# Patient Record
Sex: Female | Born: 1975 | Race: Black or African American | Hispanic: No | Marital: Married | Smoking: Never smoker
Health system: Southern US, Community
[De-identification: ages and names within clinical notes are randomized; demographics above are authoritative.]

## PROBLEM LIST (undated history)

## (undated) DIAGNOSIS — N289 Disorder of kidney and ureter, unspecified: Secondary | ICD-10-CM

## (undated) DIAGNOSIS — R809 Proteinuria, unspecified: Secondary | ICD-10-CM

---

## 1998-08-20 ENCOUNTER — Emergency Department (HOSPITAL_COMMUNITY): Admission: EM | Admit: 1998-08-20 | Discharge: 1998-08-21 | Payer: Self-pay | Admitting: Emergency Medicine

## 1998-11-13 ENCOUNTER — Ambulatory Visit (HOSPITAL_COMMUNITY): Admission: RE | Admit: 1998-11-13 | Discharge: 1998-11-13 | Payer: Self-pay | Admitting: *Deleted

## 1999-01-30 ENCOUNTER — Ambulatory Visit (HOSPITAL_COMMUNITY): Admission: RE | Admit: 1999-01-30 | Discharge: 1999-01-30 | Payer: Self-pay | Admitting: *Deleted

## 1999-03-03 ENCOUNTER — Ambulatory Visit (HOSPITAL_COMMUNITY): Admission: RE | Admit: 1999-03-03 | Discharge: 1999-03-03 | Payer: Self-pay | Admitting: Obstetrics & Gynecology

## 1999-04-10 ENCOUNTER — Inpatient Hospital Stay (HOSPITAL_COMMUNITY): Admission: AD | Admit: 1999-04-10 | Discharge: 1999-04-13 | Payer: Self-pay | Admitting: *Deleted

## 1999-10-20 ENCOUNTER — Emergency Department (HOSPITAL_COMMUNITY): Admission: EM | Admit: 1999-10-20 | Discharge: 1999-10-20 | Payer: Self-pay | Admitting: Emergency Medicine

## 2001-05-12 ENCOUNTER — Other Ambulatory Visit: Admission: RE | Admit: 2001-05-12 | Discharge: 2001-05-12 | Payer: Self-pay | Admitting: Gynecology

## 2002-05-29 ENCOUNTER — Other Ambulatory Visit: Admission: RE | Admit: 2002-05-29 | Discharge: 2002-05-29 | Payer: Self-pay | Admitting: Gynecology

## 2003-06-11 ENCOUNTER — Other Ambulatory Visit: Admission: RE | Admit: 2003-06-11 | Discharge: 2003-06-11 | Payer: Self-pay | Admitting: Gynecology

## 2004-08-18 ENCOUNTER — Other Ambulatory Visit: Admission: RE | Admit: 2004-08-18 | Discharge: 2004-08-18 | Payer: Self-pay | Admitting: Gynecology

## 2004-08-18 ENCOUNTER — Ambulatory Visit: Payer: Self-pay | Admitting: Internal Medicine

## 2005-04-21 ENCOUNTER — Inpatient Hospital Stay (HOSPITAL_COMMUNITY): Admission: AD | Admit: 2005-04-21 | Discharge: 2005-04-23 | Payer: Self-pay | Admitting: Obstetrics & Gynecology

## 2005-12-08 ENCOUNTER — Ambulatory Visit: Payer: Self-pay | Admitting: Internal Medicine

## 2006-07-16 ENCOUNTER — Encounter: Admission: RE | Admit: 2006-07-16 | Discharge: 2006-07-16 | Payer: Self-pay | Admitting: Nephrology

## 2007-08-09 ENCOUNTER — Ambulatory Visit: Payer: Self-pay | Admitting: Internal Medicine

## 2007-08-09 DIAGNOSIS — J019 Acute sinusitis, unspecified: Secondary | ICD-10-CM

## 2007-09-13 ENCOUNTER — Ambulatory Visit: Payer: Self-pay | Admitting: Internal Medicine

## 2007-09-13 DIAGNOSIS — J309 Allergic rhinitis, unspecified: Secondary | ICD-10-CM | POA: Insufficient documentation

## 2008-04-03 IMAGING — US US RENAL
1 series · 14 of 25 positions shown · non-contrast
Comparison: None.

CLINICAL DATA: Hematuria and proteinuria.  
 RENAL/URINARY TRACT ULTRASOUND:
TECHNIQUE: Complete ultrasound examination of the urinary tract was performed including evaluation of the kidneys, renal collecting systems, and urinary bladder.

[Series 1: unknown · 0.25mm/px · 14 of 27 slices shown]
[im 1/27]
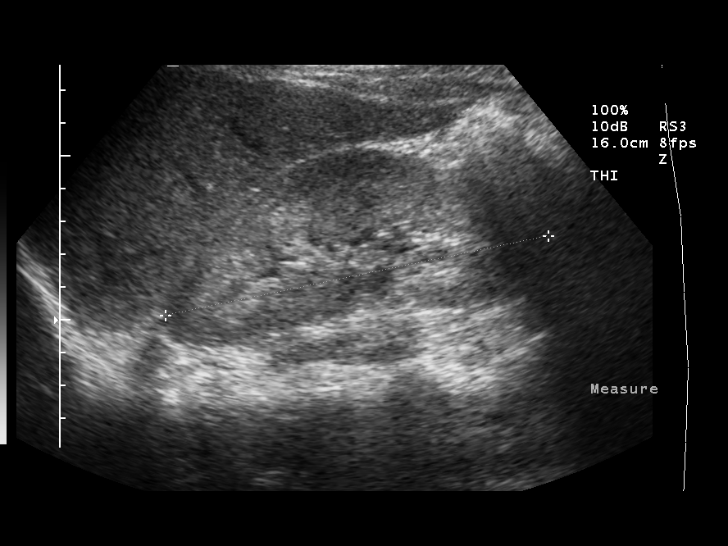
[im 3/27]
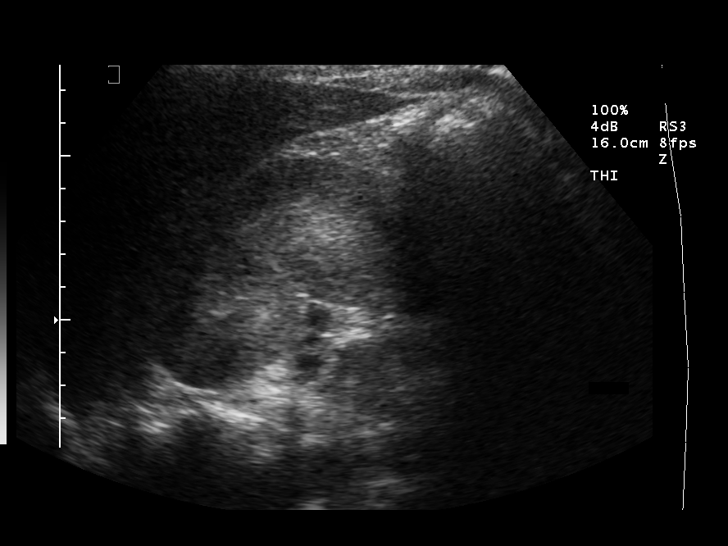
[im 5/27]
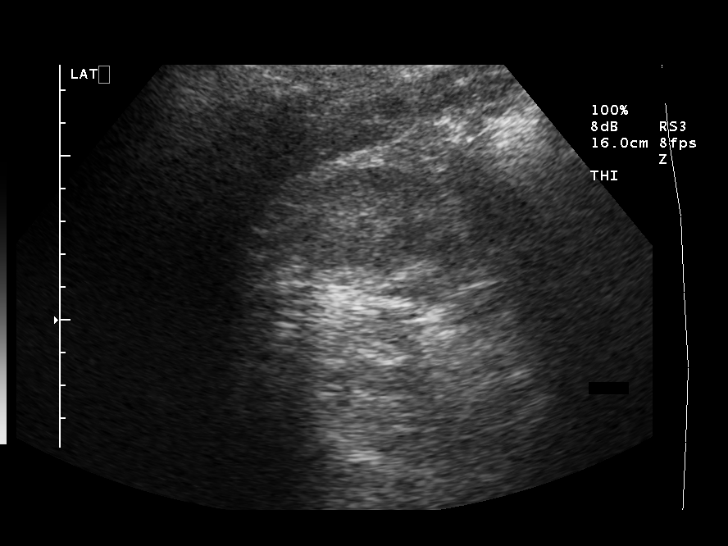
[im 7/27]
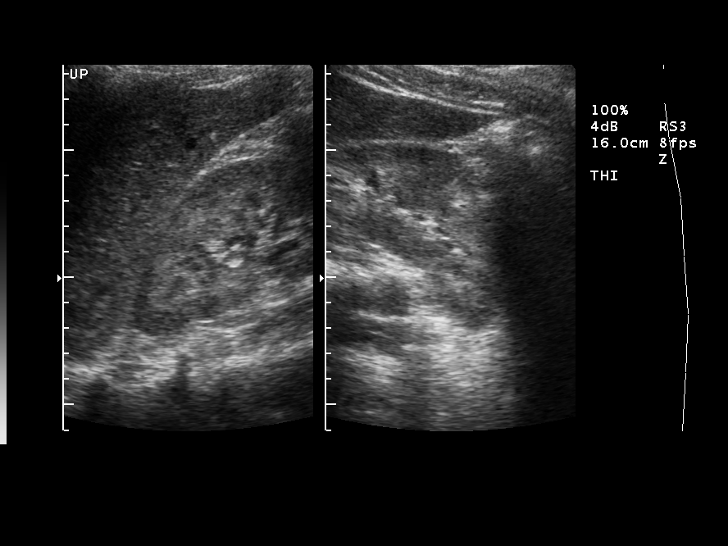
[im 9/27]
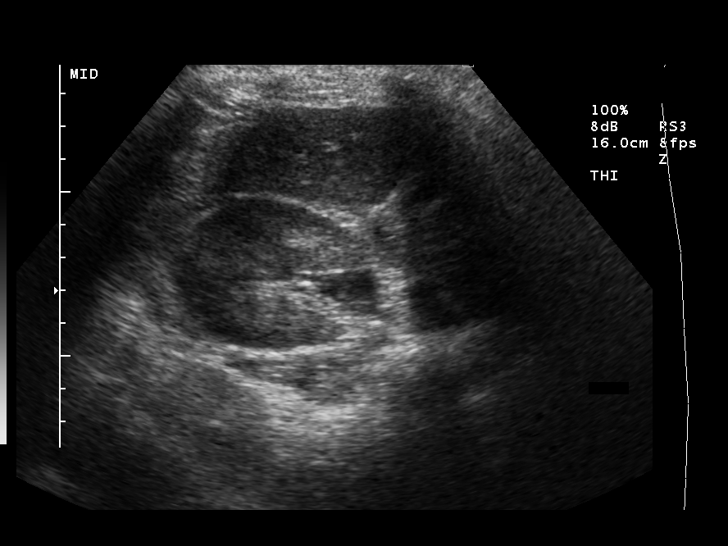
[im 10/27]
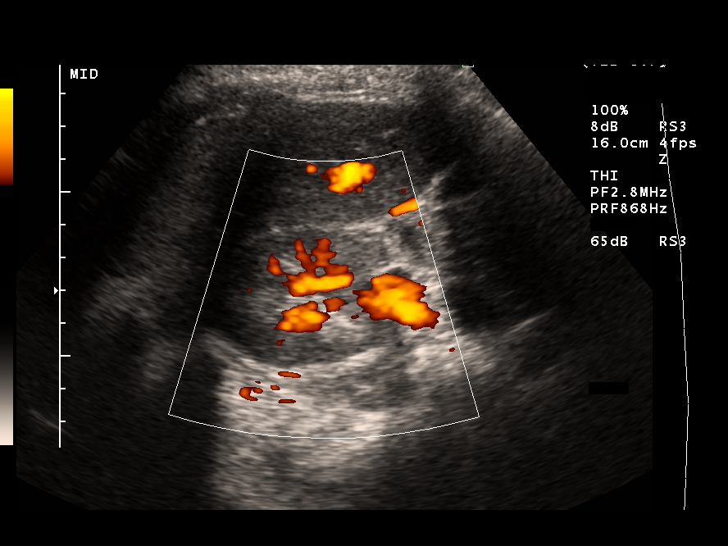
[im 12/27]
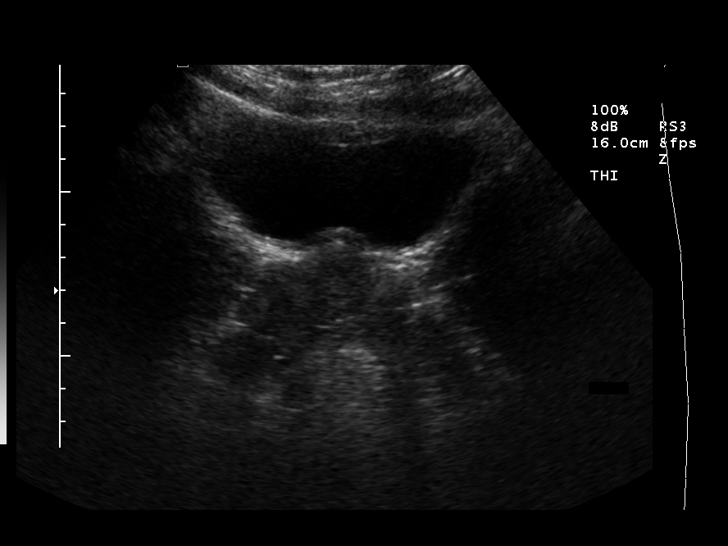
[im 15/27]
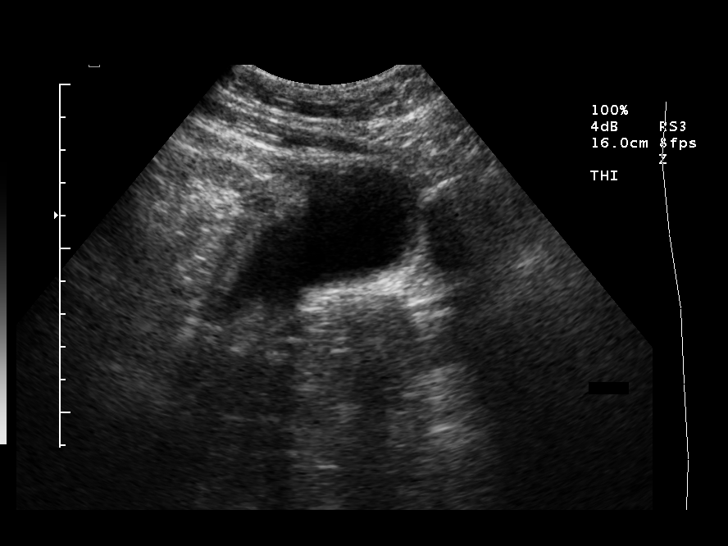
[im 17/27]
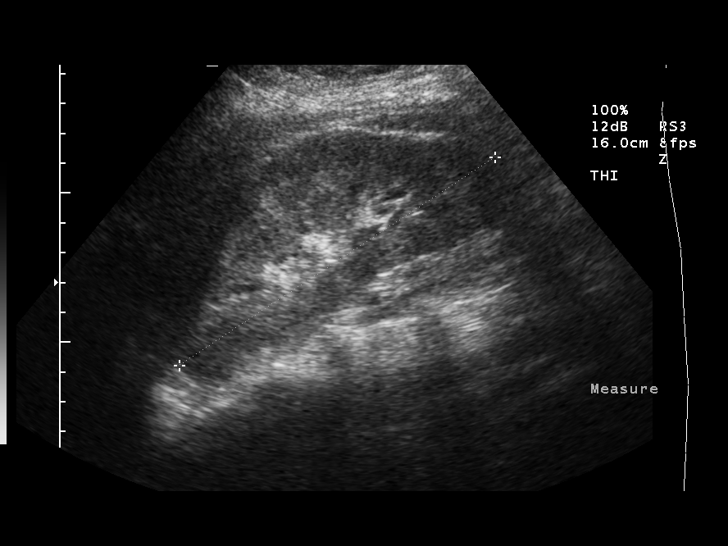
[im 18/27]
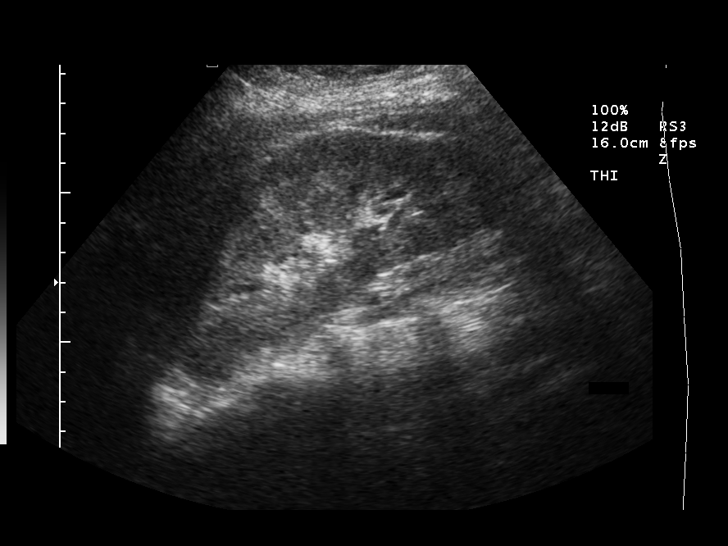
[im 20/27]
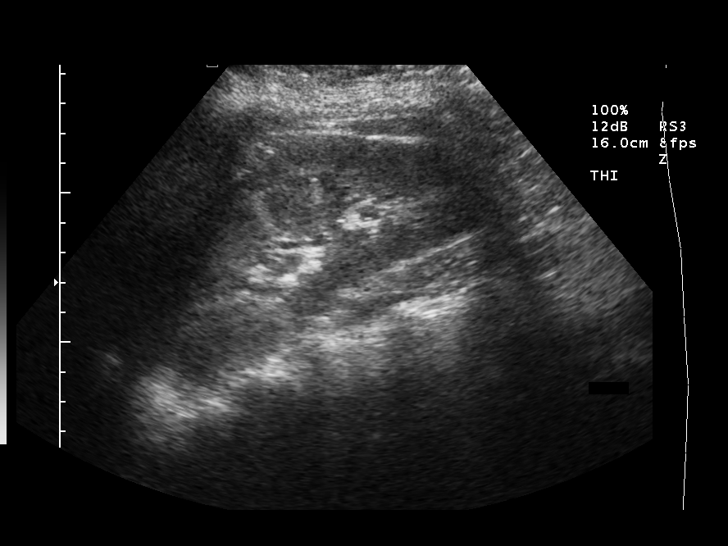
[im 22/27]
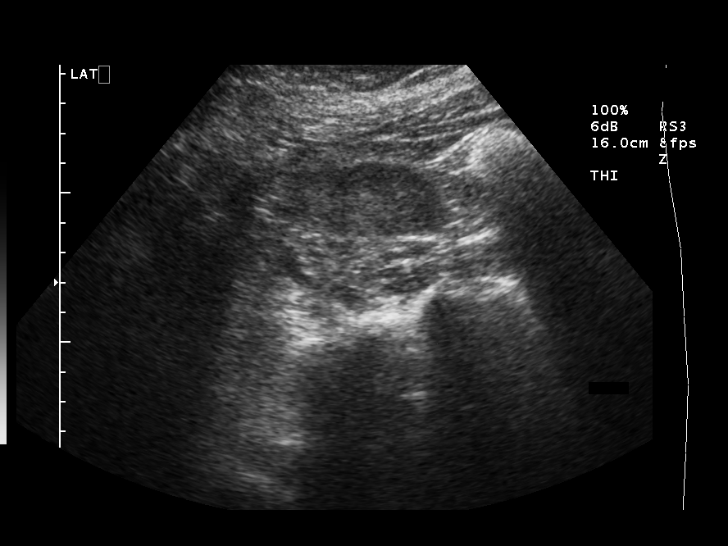
[im 24/27]
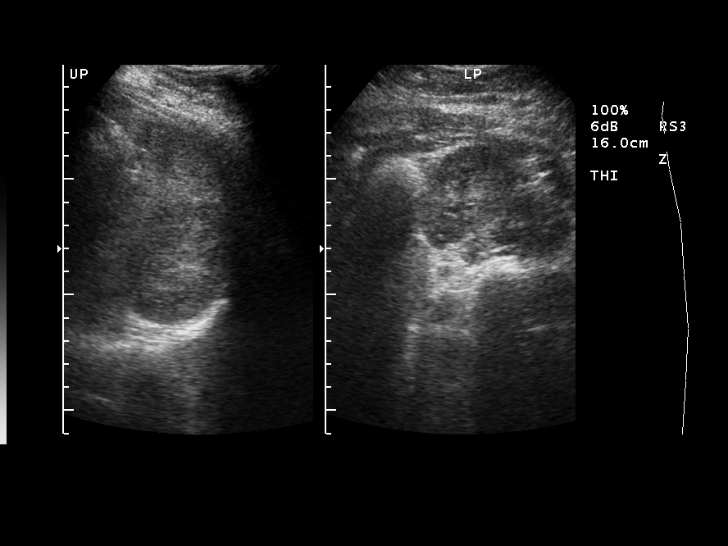
[im 27/27]
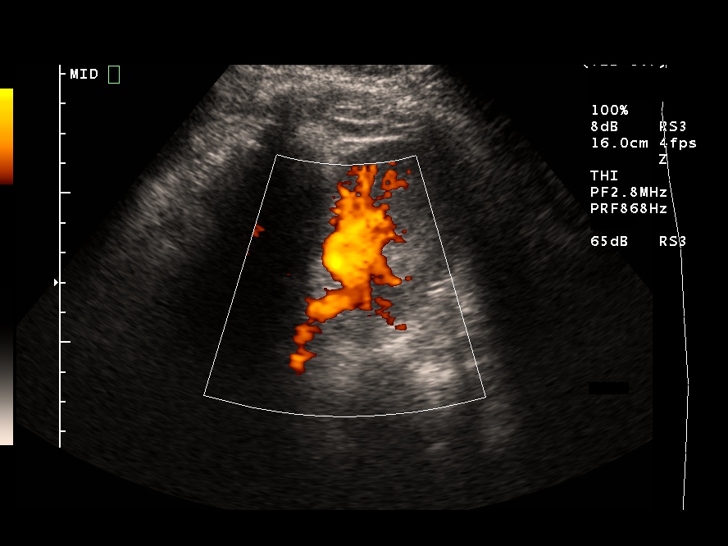

[14 of 25 positions shown; findings below may reference images not displayed]

FINDINGS: The right kidney measures 11.9 cm.  There is no obstruction or mass.   There is increased echogenicity in the cortex.  No calculi are identified. 
 The left kidney measures 12.7 cm in length.  The cortex is normal in thickness but is echogenic.  No mass or obstruction is identified.  The bladder is normal.
IMPRESSION: Normal sized kidneys without obstruction.  Increased cortical echogenicity compatible with medical renal disease.

## 2008-08-21 ENCOUNTER — Ambulatory Visit: Payer: Self-pay | Admitting: Internal Medicine

## 2009-09-20 ENCOUNTER — Ambulatory Visit: Payer: Self-pay | Admitting: Internal Medicine

## 2009-09-20 DIAGNOSIS — M7989 Other specified soft tissue disorders: Secondary | ICD-10-CM

## 2009-09-20 DIAGNOSIS — R799 Abnormal finding of blood chemistry, unspecified: Secondary | ICD-10-CM

## 2009-09-20 DIAGNOSIS — M79609 Pain in unspecified limb: Secondary | ICD-10-CM

## 2009-09-20 DIAGNOSIS — R5383 Other fatigue: Secondary | ICD-10-CM

## 2009-09-20 DIAGNOSIS — R5381 Other malaise: Secondary | ICD-10-CM

## 2009-09-23 LAB — CONVERTED CEMR LAB: Anti Nuclear Antibody(ANA): NEGATIVE

## 2009-09-24 LAB — CONVERTED CEMR LAB
ALT: 12 units/L (ref 0–35)
AST: 18 units/L (ref 0–37)
Albumin: 3.1 g/dL — ABNORMAL LOW (ref 3.5–5.2)
Alkaline Phosphatase: 63 units/L (ref 39–117)
BUN: 12 mg/dL (ref 6–23)
Basophils Absolute: 0 10*3/uL (ref 0.0–0.1)
Basophils Relative: 0.5 % (ref 0.0–3.0)
Bilirubin, Direct: 0.1 mg/dL (ref 0.0–0.3)
CO2: 28 meq/L (ref 19–32)
Calcium: 8.4 mg/dL (ref 8.4–10.5)
Chloride: 109 meq/L (ref 96–112)
Creatinine, Ser: 0.7 mg/dL (ref 0.4–1.2)
Eosinophils Absolute: 0.3 10*3/uL (ref 0.0–0.7)
Eosinophils Relative: 3.7 % (ref 0.0–5.0)
GFR calc non Af Amer: 125.82 mL/min (ref >60)
Glucose, Bld: 86 mg/dL (ref 70–99)
HCT: 38 % (ref 36.0–46.0)
Hemoglobin: 13.1 g/dL (ref 12.0–15.0)
Lymphocytes Relative: 21.2 % (ref 12.0–46.0)
Lymphs Abs: 1.7 10*3/uL (ref 0.7–4.0)
MCHC: 34.5 g/dL (ref 30.0–36.0)
MCV: 88.2 fL (ref 78.0–100.0)
Monocytes Absolute: 0.4 10*3/uL (ref 0.1–1.0)
Monocytes Relative: 5.2 % (ref 3.0–12.0)
Neutro Abs: 5.5 10*3/uL (ref 1.4–7.7)
Neutrophils Relative %: 69.4 % (ref 43.0–77.0)
Platelets: 252 10*3/uL (ref 150.0–400.0)
Potassium: 4.2 meq/L (ref 3.5–5.1)
RBC: 4.32 M/uL (ref 3.87–5.11)
RDW: 13 % (ref 11.5–14.6)
Rheumatoid fact SerPl-aCnc: 28.1 intl units/mL — ABNORMAL HIGH (ref 0.0–20.0)
Sed Rate: 31 mm/hr — ABNORMAL HIGH (ref 0–22)
Sodium: 142 meq/L (ref 135–145)
TSH: 1.58 microintl units/mL (ref 0.35–5.50)
Total Bilirubin: 0.5 mg/dL (ref 0.3–1.2)
Total Protein: 5.5 g/dL — ABNORMAL LOW (ref 6.0–8.3)
WBC: 7.9 10*3/uL (ref 4.5–10.5)

## 2010-06-10 NOTE — Miscellaneous (Signed)
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

## 2010-06-10 NOTE — Assessment & Plan Note (Signed)
Summary: SORE THROAT-CONGESTION-HANDS/LOWER LEGS PAIN/FEEL SWOLLEN DON...   Vital Signs:  Patient profile:   35 year old female Height:      63 inches Weight:      176.50 pounds BMI:     31.38 O2 Sat:      99 % on Room air Temp:     98.7 degrees F oral Pulse rate:   68 / minute Pulse rhythm:   regular BP sitting:   120 / 88  (left arm) Cuff size:   regular  Vitals Entered By: Rock Nephew CMA (Sep 20, 2009 3:33 PM)  O2 Flow:  Room air CC: congestion, sore throat w/ green mucus. Bilateral hand/foot swelling w tingling and pain Is Patient Diabetic? No   CC:  congestion and sore throat w/ green mucus. Bilateral hand/foot swelling w tingling and pain.  History of Present Illness: here withc acute onset facail pain, pressure, fever, adn greenish d/c on top of more chronic several months sinus congestion with itch, sneeze and no color;  no ST, cough and Pt denies CP, sob, doe, wheezing, orthopnea, pnd, worsening LE edema, palps, dizziness or syncope  Has unsusual fatigue recently as well without OSA symtpoms, depressive symptoms, wt loss or other constitutional symtpoms, but has had also several weeks diffuse swelling of the hand particularly the fingers and mcp with mild discomfort but not at all severe - just persistent and constant without overuse of the hands .  No trauma, injury of hx of same in the past.    Problems Prior to Update: 1)  Rheumatoid Factor, Positive  (ICD-790.99) 2)  Hand Pain, Bilateral  (ICD-729.5) 3)  Fatigue  (ICD-780.79) 4)  Edema, Hands  (ICD-729.81) 5)  Sinusitis- Acute-nos  (ICD-461.9) 6)  Sinusitis- Acute-nos  (ICD-461.9) 7)  Allergic Rhinitis  (ICD-477.9) 8)  Sinusitis- Acute-nos  (ICD-461.9) 9)  Sinusitis- Acute-nos  (ICD-461.9) 10)  Family History of Alcoholism/addiction  (ICD-V61.41)  Medications Prior to Update: 1)  Bcp .Marland Kitchen.. 1 By Mouth Qd 2)  Mult. Vit. .... 1 By Mouth Qd 3)  Cephalexin 500 Mg Caps (Cephalexin) .Marland Kitchen.. 1 By Mouth Three Times A  Day 4)  Cetirizine Hcl 10 Mg  Tabs (Cetirizine Hcl) .Marland Kitchen.. 1po Once Daily Prn  Current Medications (verified): 1)  Bcp .Marland Kitchen.. 1 By Mouth Qd 2)  Mult. Vit. .... 1 By Mouth Qd 3)  Doxycycline Hyclate 100 Mg Caps (Doxycycline Hyclate) .Marland Kitchen.. 1po Two Times A Day 4)  Hydrochlorothiazide 12.5 Mg Caps (Hydrochlorothiazide) .Marland Kitchen.. 1po Once Daily As Needed Swelling 5)  Tessalon Perles 100 Mg Caps (Benzonatate) .Marland Kitchen.. 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  , mild ill  Head:  normocephalic and atraumatic.   Eyes:  vision grossly intact, pupils equal, and pupils round.   Ears:  bilat tm's red, sinus tender bilat Nose:  nasal dischargemucosal pallor and mucosal edema.   Mouth:  pharyngeal erythema and  fair dentition.   Neck:  supple and cervical lymphadenopathy.   Lungs:  normal respiratory effort and normal breath sounds.   Heart:  normal rate and regular rhythm.   Msk:  diffuse swelling or the bilat hands and digits, mild with mild tender but no blearly worse at the MCPs to me Extremities:  no edema, no erythema  Neurologic:  strength normal in all extremities.     Impression & Recommendations:  Problem # 1:  SINUSITIS- ACUTE-NOS (ICD-461.9)  Her updated medication list for this problem includes:    Doxycycline Hyclate 100 Mg Caps (Doxycycline  hyclate) .Marland Kitchen... 1po two times a day    Tessalon Perles 100 Mg Caps (Benzonatate) .Marland Kitchen... 1 - 2 by mouth three times a day as needed for cough treat as above, f/u any worsening signs or symptoms   Problem # 2:  EDEMA, HANDS (ICD-729.81)  unusual with mild discomfort with mild diffuse soft tissue swelling, but not c/w overuse it seems - for hctz 12.5 as needed, check esr, rf, ana, and consider rheum consult  Orders: T-Antinuclear Antib (ANA) ([id number]) TLB-Sedimentation Rate (ESR) (85652-ESR) TLB-Rheumatoid Factor (RA) (09811-BJ)  Problem # 3:  ALLERGIC RHINITIS (ICD-477.9)  The following medications were removed from the medication list:    Cetirizine Hcl 10 Mg Tabs (Cetirizine hcl) .Marland Kitchen... 1po once daily prn for otc claritin as needed   Problem # 4:  FATIGUE (ICD-780.79) exam benign, to check labs below; follow with expectant management  Orders: TLB-BMP (Basic Metabolic Panel-BMET) (80048-METABOL) TLB-CBC Platelet - w/Differential (85025-CBCD) TLB-Hepatic/Liver Function Pnl (80076-HEPATIC) TLB-TSH (Thyroid Stimulating Hormone) (84443-TSH)  Complete Medication List: 1)  Bcp  .Marland Kitchen.. 1 by mouth qd 2)  Mult. Vit.  .... 1 by mouth qd 3)  Doxycycline Hyclate 100 Mg Caps (Doxycycline hyclate) .Marland Kitchen.. 1po two times a day 4)  Hydrochlorothiazide 12.5 Mg Caps (Hydrochlorothiazide) .Marland Kitchen.. 1po once daily as needed swelling 5)  Tessalon Perles 100 Mg Caps (Benzonatate) .Marland Kitchen.. 1 - 2 by mouth three times a day as needed for cough  Patient Instructions: 1)  Please take all new medications as prescribed 2)  Continue all previous medications as before this visit  3)  Please go to the Lab in the basement for your blood tests today  4)  Please schedule a follow-up appointment as needed. Prescriptions: TESSALON PERLES 100 MG CAPS (BENZONATATE) 1 - 2 by mouth three times a day as needed for cough  #60 x 1   Entered and Authorized by:   Corwin Levins MD   Signed by:   Corwin Levins MD on 09/20/2009   Method  used:   Print then Give to Patient   RxID:   [id number] HYDROCHLOROTHIAZIDE 12.5 MG CAPS (HYDROCHLOROTHIAZIDE) 1po once daily as needed swelling  #30 x 5   Entered and Authorized by:   Corwin Levins MD   Signed by:   Corwin Levins MD on 09/20/2009   Method used:   Print then Give to Patient   RxID:   [id number] DOXYCYCLINE HYCLATE 100 MG CAPS (DOXYCYCLINE HYCLATE) 1po two times a day  #20 x 0   Entered and Authorized by:   Corwin Levins MD   Signed by:   Corwin Levins MD on 09/20/2009   Method used:   Print then Give to Patient   RxID:   [id number]

## 2012-01-07 ENCOUNTER — Emergency Department (HOSPITAL_BASED_OUTPATIENT_CLINIC_OR_DEPARTMENT_OTHER)
Admission: EM | Admit: 2012-01-07 | Discharge: 2012-01-07 | Disposition: A | Discharge status: Home / Self Care | Payer: BC Managed Care – PPO | Attending: Emergency Medicine | Admitting: Emergency Medicine

## 2012-01-07 ENCOUNTER — Encounter (HOSPITAL_BASED_OUTPATIENT_CLINIC_OR_DEPARTMENT_OTHER): Payer: Self-pay | Admitting: *Deleted

## 2012-01-07 DIAGNOSIS — R51 Headache: Secondary | ICD-10-CM | POA: Insufficient documentation

## 2012-01-07 DIAGNOSIS — I10 Essential (primary) hypertension: Secondary | ICD-10-CM | POA: Insufficient documentation

## 2012-01-07 DIAGNOSIS — R944 Abnormal results of kidney function studies: Secondary | ICD-10-CM | POA: Insufficient documentation

## 2012-01-07 HISTORY — DX: Proteinuria, unspecified: R80.9

## 2012-01-07 LAB — BASIC METABOLIC PANEL
CO2: 26 mEq/L (ref 19–32)
Calcium: 9 mg/dL (ref 8.4–10.5)
Creatinine, Ser: 1.6 mg/dL — ABNORMAL HIGH (ref 0.50–1.10)
Glucose, Bld: 130 mg/dL — ABNORMAL HIGH (ref 70–99)

## 2012-01-07 LAB — CBC WITH DIFFERENTIAL/PLATELET
Basophils Absolute: 0 10*3/uL (ref 0.0–0.1)
Eosinophils Absolute: 0.2 10*3/uL (ref 0.0–0.7)
Eosinophils Relative: 2 % (ref 0–5)
MCH: 29.3 pg (ref 26.0–34.0)
MCV: 83 fL (ref 78.0–100.0)
Platelets: 234 10*3/uL (ref 150–400)
RDW: 12.6 % (ref 11.5–15.5)

## 2012-01-07 LAB — PREGNANCY, URINE: Preg Test, Ur: NEGATIVE

## 2012-01-07 MED ORDER — HYDROCHLOROTHIAZIDE 25 MG PO TABS
25.0000 mg | ORAL_TABLET | Freq: Every day | ORAL | Status: DC
  Administered 2012-01-07: 25 mg via ORAL
  Filled 2012-01-07: qty 1

## 2012-01-07 NOTE — ED Notes (Signed)
Patient states she has a 4-5 month history of headaches.  Started seeing her PCP for them in June.  Was referred to a Neurologist and was seen 2 days ago.  BP was found to be 190/130.  Was started on a pain med for her headaches and a blood pressure medication.  Was to follow up today with PCP and could not get an early appointment and was sent here for further evaluation.

## 2012-01-07 NOTE — ED Provider Notes (Signed)
History     CSN: [id number]  Arrival date & time 01/07/12  1210   First MD Initiated Contact with Patient 01/07/12 1259      Chief Complaint  Patient presents with  . Hypertension    (Consider location/radiation/quality/duration/timing/severity/associated sxs/prior treatment) Patient is a 36 y.o. female presenting with hypertension. The history is provided by the patient. No language interpreter was used.  Hypertension This is a new problem. Episode onset: 2 days ago. The problem occurs constantly. The problem has been gradually worsening. Associated symptoms include headaches. Pertinent negatives include no fever. Nothing aggravates the symptoms. She has tried nothing for the symptoms. The treatment provided no relief.  Pt saw her neurologist 2 days ago for headaches and she was diagnosed with htn.  Pt is taking metoprolol 25 mg every night.   Past Medical History  Diagnosis Date  . Protein in urine     History reviewed. No pertinent past surgical history.  No family history on file.  History  Substance Use Topics  . Smoking status: Never Smoker   . Smokeless tobacco: Not on file  . Alcohol Use: No    OB History    Grav Para Term Preterm Abortions TAB SAB Ect Mult Living                  Review of Systems  Constitutional: Negative for fever.  Neurological: Positive for headaches.  All other systems reviewed and are negative.    Allergies  Review of patient's allergies indicates no known allergies.  Home Medications   Current Outpatient Rx  Name Route Sig Dispense Refill  . METOPROLOL TARTRATE 25 MG PO TABS Oral Take 25 mg by mouth 2 (two) times daily.      BP 171/126  Pulse 90  Temp 97.9 F (36.6 C) (Axillary)  Resp 18  Ht 5\' 3"  (1.6 m)  Wt 170 lb (77.111 kg)  BMI 30.11 kg/m2  SpO2 100%  LMP 12/24/2011  Physical Exam  Nursing note and vitals reviewed. Constitutional: She is oriented to person, place, and time. She appears well-developed and  well-nourished.  HENT:  Head: Normocephalic and atraumatic.  Right Ear: External ear normal.  Left Ear: External ear normal.  Nose: Nose normal.  Mouth/Throat: Oropharynx is clear and moist.  Eyes: Conjunctivae and EOM are normal. Pupils are equal, round, and reactive to light.  Neck: Normal range of motion. Neck supple.  Cardiovascular: Normal rate.   Pulmonary/Chest: Effort normal and breath sounds normal.  Abdominal: Soft.  Musculoskeletal: Normal range of motion.  Neurological: She is alert and oriented to person, place, and time. She has normal reflexes.  Skin: Skin is warm.  Psychiatric: She has a normal mood and affect.    ED Course  Procedures (including critical care time)   Labs Reviewed  PREGNANCY, URINE  BASIC METABOLIC PANEL  CBC WITH DIFFERENTIAL   No results found.   No diagnosis found.   Date: 01/07/2012  Rate:75  Rhythm: normal sinus rhythm  QRS Axis: normal  Intervals: normal  ST/T Wave abnormalities: normal  Conduction Disutrbances:none  Narrative Interpretation:   Old EKG Reviewed: none available   MDM  Pt counseled on elevated creatine level.   I advised pt with the history of protein in her urine I think her MD should follow her creatine.  I advised nephrology consult.   Pt advised to increase metoprolol to twice a day        Lonia Skinner [place], [place] 01/07/12 1554

## 2012-01-07 NOTE — ED Notes (Signed)
Karen Sophia, PA-C at bedside. 

## 2012-01-08 NOTE — ED Provider Notes (Signed)
Medical screening examination/treatment/procedure(s) were performed by non-physician practitioner and as supervising physician I was immediately available for consultation/collaboration.  Geoffery Lyons, MD 01/08/12 [phone]

## 2014-08-25 ENCOUNTER — Emergency Department (HOSPITAL_BASED_OUTPATIENT_CLINIC_OR_DEPARTMENT_OTHER): Payer: BC Managed Care – PPO

## 2014-08-25 ENCOUNTER — Emergency Department (HOSPITAL_BASED_OUTPATIENT_CLINIC_OR_DEPARTMENT_OTHER)
Admission: EM | Admit: 2014-08-25 | Discharge: 2014-08-25 | Disposition: A | Discharge status: Home / Self Care | Payer: BC Managed Care – PPO | Attending: Emergency Medicine | Admitting: Emergency Medicine

## 2014-08-25 ENCOUNTER — Encounter (HOSPITAL_BASED_OUTPATIENT_CLINIC_OR_DEPARTMENT_OTHER): Payer: Self-pay | Admitting: *Deleted

## 2014-08-25 DIAGNOSIS — I82402 Acute embolism and thrombosis of unspecified deep veins of left lower extremity: Secondary | ICD-10-CM | POA: Insufficient documentation

## 2014-08-25 DIAGNOSIS — Z79899 Other long term (current) drug therapy: Secondary | ICD-10-CM | POA: Insufficient documentation

## 2014-08-25 DIAGNOSIS — Z87448 Personal history of other diseases of urinary system: Secondary | ICD-10-CM | POA: Diagnosis not present

## 2014-08-25 DIAGNOSIS — M79605 Pain in left leg: Secondary | ICD-10-CM | POA: Diagnosis present

## 2014-08-25 HISTORY — DX: Disorder of kidney and ureter, unspecified: N28.9

## 2014-08-25 LAB — BASIC METABOLIC PANEL
ANION GAP: 5 (ref 5–15)
BUN: 25 mg/dL — ABNORMAL HIGH (ref 6–23)
CALCIUM: 8.8 mg/dL (ref 8.4–10.5)
CHLORIDE: 109 mmol/L (ref 96–112)
CO2: 23 mmol/L (ref 19–32)
CREATININE: 1.64 mg/dL — AB (ref 0.50–1.10)
GFR, EST AFRICAN AMERICAN: 45 mL/min — AB (ref >90)
GFR, EST NON AFRICAN AMERICAN: 39 mL/min — AB (ref >90)
GLUCOSE: 99 mg/dL (ref 70–99)
POTASSIUM: 4.7 mmol/L (ref 3.5–5.1)
Sodium: 137 mmol/L (ref 135–145)

## 2014-08-25 LAB — CBC WITH DIFFERENTIAL/PLATELET
BASOS ABS: 0 10*3/uL (ref 0.0–0.1)
BASOS PCT: 0 % (ref 0–1)
EOS ABS: 0.6 10*3/uL (ref 0.0–0.7)
EOS PCT: 9 % — AB (ref 0–5)
HCT: 35.6 % — ABNORMAL LOW (ref 36.0–46.0)
Hemoglobin: 11.9 g/dL — ABNORMAL LOW (ref 12.0–15.0)
LYMPHS PCT: 19 % (ref 12–46)
Lymphs Abs: 1.4 10*3/uL (ref 0.7–4.0)
MCH: 28.9 pg (ref 26.0–34.0)
MCHC: 33.4 g/dL (ref 30.0–36.0)
MCV: 86.4 fL (ref 78.0–100.0)
MONO ABS: 0.6 10*3/uL (ref 0.1–1.0)
MONOS PCT: 8 % (ref 3–12)
Neutro Abs: 4.7 10*3/uL (ref 1.7–7.7)
Neutrophils Relative %: 64 % (ref 43–77)
Platelets: 184 10*3/uL (ref 150–400)
RBC: 4.12 MIL/uL (ref 3.87–5.11)
RDW: 12.9 % (ref 11.5–15.5)
WBC: 7.2 10*3/uL (ref 4.0–10.5)

## 2014-08-25 MED ORDER — XARELTO VTE STARTER PACK 15 & 20 MG PO TBPK: 15.0000 mg | ORAL_TABLET | ORAL | Status: DC

## 2014-08-25 NOTE — Discharge Instructions (Signed)
Deep Vein Thrombosis °A deep vein thrombosis (DVT) is a blood clot that develops in the deep, larger veins of the leg, arm, or pelvis. These are more dangerous than clots that might form in veins near the surface of the body. A DVT can lead to serious and even life-threatening complications if the clot breaks off and travels in the bloodstream to the lungs.  °A DVT can damage the valves in your leg veins so that instead of flowing upward, the blood pools in the lower leg. This is called post-thrombotic syndrome, and it can result in pain, swelling, discoloration, and sores on the leg. °CAUSES °Usually, several things contribute to the formation of blood clots. Contributing factors include: °· The flow of blood slows down. °· The inside of the vein is damaged in some way. °· You have a condition that makes blood clot more easily. °RISK FACTORS °Some people are more likely than others to develop blood clots. Risk factors include:  °· Smoking. °· Being overweight (obese). °· Sitting or lying still for a long time. This includes long-distance travel, paralysis, or recovery from an illness or surgery. °Other factors that increase risk are:  °· Older age, especially over 75 years of age. °· Having a family history of blood clots or if you have already had a blot clot. °· Having major or lengthy surgery. This is especially true for surgery on the hip, knee, or belly (abdomen). Hip surgery is particularly high risk. °· Having a long, thin tube (catheter) placed inside a vein during a medical procedure. °· Breaking a hip or leg. °· Having cancer or cancer treatment. °· Pregnancy and childbirth. °¨ Hormone changes make the blood clot more easily during pregnancy. °¨ The fetus puts pressure on the veins of the pelvis. °¨ There is a risk of injury to veins during delivery or a caesarean delivery. The risk is highest just after childbirth. °· Medicines containing the female hormone estrogen. This includes birth control pills and  hormone replacement therapy. °· Other circulation or heart problems. ° °SIGNS AND SYMPTOMS °When a clot forms, it can either partially or totally block the blood flow in that vein. Symptoms of a DVT can include: °· Swelling of the leg or arm, especially if one side is much worse. °· Warmth and redness of the leg or arm, especially if one side is much worse. °· Pain in an arm or leg. If the clot is in the leg, symptoms may be more noticeable or worse when standing or walking. °The symptoms of a DVT that has traveled to the lungs (pulmonary embolism, PE) usually start suddenly and include: °· Shortness of breath. °· Coughing. °· Coughing up blood or blood-tinged mucus. °· Chest pain. The chest pain is often worse with deep breaths. °· Rapid heartbeat. °Anyone with these symptoms should get emergency medical treatment right away. Do not wait to see if the symptoms will go away. Call your local emergency services (911 in the [place] if you have these symptoms. Do not drive yourself to the hospital. °DIAGNOSIS °If a DVT is suspected, your health care provider will take a full medical history and perform a physical exam. Tests that also may be required include: °· Blood tests, including studies of the clotting properties of the blood. °· Ultrasound to see if you have clots in your legs or lungs. °· X-rays to show the flow of blood when dye is injected into the veins (venogram). °· Studies of your lungs if you have any   chest symptoms. °PREVENTION °· Exercise the legs regularly. Take a brisk 30-minute walk every day. °· Maintain a weight that is appropriate for your height. °· Avoid sitting or lying in bed for long periods of time without moving your legs. °· Women, particularly those over the age of 35 years, should consider the risks and benefits of taking estrogen medicines, including birth control pills. °· Do not smoke, especially if you take estrogen medicines. °· Long-distance travel can increase your risk of DVT. You  should exercise your legs by walking or pumping the muscles every hour. °· Many of the risk factors above relate to situations that exist with hospitalization, either for illness, injury, or elective surgery. Prevention may include medical and nonmedical measures. °¨ Your health care provider will assess you for the need for venous thromboembolism prevention when you are admitted to the hospital. If you are having surgery, your surgeon will assess you the day of or day after surgery. °TREATMENT °Once identified, a DVT can be treated. It can also be prevented in some circumstances. Once you have had a DVT, you may be at increased risk for a DVT in the future. The most common treatment for DVT is blood-thinning (anticoagulant) medicine, which reduces the blood's tendency to clot. Anticoagulants can stop new blood clots from forming and stop old clots from growing. They cannot dissolve existing clots. Your body does this by itself over time. Anticoagulants can be given by mouth, through an IV tube, or by injection. Your health care provider will determine the best program for you. Other medicines or treatments that may be used are: °· Heparin or related medicines (low molecular weight heparin) are often the first treatment for a blood clot. They act quickly. However, they cannot be taken orally and must be given either in shot form or by IV tube. °¨ Heparin can cause a fall in a component of blood that stops bleeding and forms blood clots (platelets). You will be monitored with blood tests to be sure this does not occur. °· Warfarin is an anticoagulant that can be swallowed. It takes a few days to start working, so usually heparin or related medicines are used in combination. Once warfarin is working, heparin is usually stopped. °· Factor Xa inhibitor medicines, such as rivaroxaban and apixaban, also reduce blood clotting. These medicines are taken orally and can often be used without heparin or related  medicines. °· Less commonly, clot dissolving drugs (thrombolytics) are used to dissolve a DVT. They carry a high risk of bleeding, so they are used mainly in severe cases where your life or a part of your body is threatened. °· Very rarely, a blood clot in the leg needs to be removed surgically. °· If you are unable to take anticoagulants, your health care provider may arrange for you to have a filter placed in a main vein in your abdomen. This filter prevents clots from traveling to your lungs. °HOME CARE INSTRUCTIONS °· Take all medicines as directed by your health care provider. °· Learn as much as you can about DVT. °· Wear a medical alert bracelet or carry a medical alert card. °· Ask your health care provider how soon you can go back to normal activities. It is important to stay active to prevent blood clots. If you are on anticoagulant medicine, avoid contact sports. °· It is very important to exercise. This is especially important while traveling, sitting, or standing for long periods of time. Exercise your legs by walking or by   tightening and relaxing your leg muscles regularly. Take frequent walks. °· You may need to wear compression stockings. These are tight elastic stockings that apply pressure to the lower legs. This pressure can help keep the blood in the legs from clotting. °Taking Warfarin °Warfarin is a daily medicine that is taken by mouth. Your health care provider will advise you on the length of treatment (usually 3-6 months, sometimes lifelong). If you take warfarin: °· Understand how to take warfarin and foods that can affect how warfarin works in your body. °· Too much and too little warfarin are both dangerous. Too much warfarin increases the risk of bleeding. Too little warfarin continues to allow the risk for blood clots. °Warfarin and Regular Blood Testing °While taking warfarin, you will need to have regular blood tests to measure your blood clotting time. These blood tests usually  include both the prothrombin time (PT) and international normalized ratio (INR) tests. The PT and INR results allow your health care provider to adjust your dose of warfarin. It is very important that you have your PT and INR tested as often as directed by your health care provider.    °Warfarin and Your Diet °Avoid major changes in your diet, or notify your health care provider before changing your diet. Arrange a visit with a registered dietitian to answer your questions. Many foods, especially foods high in vitamin K, can interfere with warfarin and affect the PT and INR results. You should eat a consistent amount of foods high in vitamin K. Foods high in vitamin K include:  °· Spinach, kale, broccoli, cabbage, collard and turnip greens, Brussels sprouts, peas, cauliflower, seaweed, and parsley. °· Beef and pork liver. °· Green tea. °· Soybean oil. °Warfarin with Other Medicines °Many medicines can interfere with warfarin and affect the PT and INR results. You must: °· Tell your health care provider about any and all medicines, vitamins, and supplements you take, including aspirin and other over-the-counter anti-inflammatory medicines. Be especially cautious with aspirin and anti-inflammatory medicines. Ask your health care provider before taking these. °· Do not take or discontinue any prescribed or over-the-counter medicine except on the advice of your health care provider or pharmacist. °Warfarin Side Effects °Warfarin can have side effects, such as easy bruising and difficulty stopping bleeding. Ask your health care provider or pharmacist about other side effects of warfarin. You will need to: °· Hold pressure over cuts for longer than usual. °· Notify your dentist and other health care providers that you are taking warfarin before you undergo any procedures where bleeding may occur. °Warfarin with Alcohol and Tobacco  °· Drinking alcohol frequently can increase the effect of warfarin, leading to excess  bleeding. It is best to avoid alcoholic drinks or to consume only very small amounts while taking warfarin. Notify your health care provider if you change your alcohol intake.   °· Do not use any tobacco products including cigarettes, chewing tobacco, or electronic cigarettes. If you smoke, quit. Ask your health care provider for help with quitting smoking. °Alternative Medicines to Warfarin: Factor Xa Inhibitor Medicines °· These blood-thinning medicines are taken by mouth, usually for several weeks or longer. It is important to take the medicine every single day at the same time each day. °· There are no regular blood tests required when using these medicines. °· There are fewer food and drug interactions than with warfarin. °· The side effects of this class of medicine are similar to those of warfarin, including excessive bruising or bleeding. Ask your   health care provider or pharmacist about other potential side effects. °SEEK MEDICAL CARE IF: °· You notice a rapid heartbeat. °· You feel weaker or more tired than usual. °· You feel faint. °· You notice increased bruising. °· You feel your symptoms are not getting better in the time expected. °· You believe you are having side effects of medicine. °SEEK IMMEDIATE MEDICAL CARE IF: °· You have chest pain. °· You have trouble breathing. °· You have new or increased swelling or pain in one leg. °· You cough up blood. °· You notice blood in vomit, in a bowel movement, or in urine. °MAKE SURE YOU: °· Understand these instructions. °· Will watch your condition. °· Will get help right away if you are not doing well or get worse. °Document Released: 04/27/2005 Document Revised: 09/11/2013 Document Reviewed: 01/02/2013 °[employer]® Patient Information ©2015 [employer]. This information is not intended to replace advice given to you by your health care provider. Make sure you discuss any questions you have with your health care provider. ° °Rivaroxaban oral tablets °What  is this medicine? °RIVAROXABAN (ri va ROX a ban) is an anticoagulant (blood thinner). It is used to treat blood clots in the lungs or in the veins. It is also used after knee or hip surgeries to prevent blood clots. It is also used to lower the chance of stroke in people with a medical condition called atrial fibrillation. °This medicine may be used for other purposes; ask your health care provider or pharmacist if you have questions. °COMMON BRAND NAME(S): Xarelto, Xarelto Starter Pack °What should I tell my health care provider before I take this medicine? °They need to know if you have any of these conditions: °-bleeding disorders °-bleeding in the brain °-blood in your stools (black or tarry stools) or if you have blood in your vomit °-history of stomach bleeding °-kidney disease °-liver disease °-low blood counts, like low white cell, platelet, or red cell counts °-recent or planned spinal or epidural procedure °-take medicines that treat or prevent blood clots °-an unusual or allergic reaction to rivaroxaban, other medicines, foods, dyes, or preservatives °-pregnant or trying to get pregnant °-breast-feeding °How should I use this medicine? °Take this medicine by mouth with a glass of water. Follow the directions on the prescription label. Take your medicine at regular intervals. Do not take it more often than directed. Do not stop taking except on your doctor's advice. Stopping this medicine may increase your risk of a blot clot. Be sure to refill your prescription before you run out of medicine. °If you are taking this medicine after hip or knee replacement surgery, take it with or without food. If you are taking this medicine for atrial fibrillation, take it with your evening meal. If you are taking this medicine to treat blood clots, take it with food at the same time each day. If you are unable to swallow your tablet, you may crush the tablet and mix it in applesauce. Then, immediately eat the applesauce.  You should eat more food right after you eat the applesauce containing the crushed tablet. °Talk to your pediatrician regarding the use of this medicine in children. Special care may be needed. °Overdosage: If you think you have taken too much of this medicine contact a poison control center or emergency room at once. °NOTE: This medicine is only for you. Do not share this medicine with others. °What if I miss a dose? °If you take your medicine once a day and miss   a dose, take the missed dose as soon as you remember. If you take your medicine twice a day and miss a dose, take the missed dose immediately. In this instance, 2 tablets may be taken at the same time. The next day you should take 1 tablet twice a day as directed. °What may interact with this medicine? °-aspirin and aspirin-like medicines °-certain antibiotics like erythromycin, azithromycin, and clarithromycin °-certain medicines for fungal infections like ketoconazole and itraconazole °-certain medicines for irregular heart beat like amiodarone, quinidine, dronedarone °-certain medicines for seizures like carbamazepine, phenytoin °-certain medicines that treat or prevent blood clots like warfarin, enoxaparin, and dalteparin °-conivaptan °-diltiazem °-felodipine °-indinavir °-lopinavir; ritonavir °-NSAIDS, medicines for pain and inflammation, like ibuprofen or naproxen °-ranolazine °-rifampin °-ritonavir °-St. John's wort °-verapamil °This list may not describe all possible interactions. Give your health care provider a list of all the medicines, herbs, non-prescription drugs, or dietary supplements you use. Also tell them if you smoke, drink alcohol, or use illegal drugs. Some items may interact with your medicine. °What should I watch for while using this medicine? °Visit your doctor or health care professional for regular checks on your progress. Your condition will be monitored carefully while you are receiving this medicine. °Notify your doctor or  health care professional and seek emergency treatment if you develop breathing problems; changes in vision; chest pain; severe, sudden headache; pain, swelling, warmth in the leg; trouble speaking; sudden numbness or weakness of the face, arm, or leg. These can be signs that your condition has gotten worse. °If you are going to have surgery, tell your doctor or health care professional that you are taking this medicine. °Tell your health care professional that you use this medicine before you have a spinal or epidural procedure. Sometimes people who take this medicine have bleeding problems around the spine when they have a spinal or epidural procedure. This bleeding is very rare. If you have a spinal or epidural procedure while on this medicine, call your health care professional immediately if you have back pain, numbness or tingling (especially in your legs and feet), muscle weakness, paralysis, or loss of bladder or bowel control. °Avoid sports and activities that might cause injury while you are using this medicine. Severe falls or injuries can cause unseen bleeding. Be careful when using sharp tools or knives. Consider using an electric razor. Take special care brushing or flossing your teeth. Report any injuries, bruising, or red spots on the skin to your doctor or health care professional. °What side effects may I notice from receiving this medicine? °Side effects that you should report to your doctor or health care professional as soon as possible: °-allergic reactions like skin rash, itching or hives, swelling of the face, lips, or tongue °-back pain °-redness, blistering, peeling or loosening of the skin, including inside the mouth °-signs and symptoms of bleeding such as bloody or black, tarry stools; red or dark-brown urine; spitting up blood or brown material that looks like coffee grounds; red spots on the skin; unusual bruising or bleeding from the eye, gums, or nose °Side effects that usually do not  require medical attention (Report these to your doctor or health care professional if they continue or are bothersome.): °-dizziness °-muscle pain °This list may not describe all possible side effects. Call your doctor for medical advice about side effects. You may report side effects to FDA at 1-800-FDA-1088. °Where should I keep my medicine? °Keep out of the reach of children. °Store at room temperature   between 15 and 30 degrees C (59 and 86 degrees F). Throw away any unused medicine after the expiration date. °NOTE: This sheet is a summary. It may not cover all possible information. If you have questions about this medicine, talk to your doctor, pharmacist, or health care provider. °© 2015, [employer]. (2013-08-17 18:47:48) ° ° °

## 2014-08-25 NOTE — ED Notes (Signed)
C/o left leg pain since Monday. State woke her up and felt like it was going to cramp. C/o left calf pain. No redness noted. 2+ pedals.

## 2014-08-25 NOTE — ED Provider Notes (Signed)
CSN: [id number]     Arrival date & time 08/25/14  1038 History   First MD Initiated Contact with Patient 08/25/14 1103     Chief Complaint  Patient presents with  . Leg Pain     (Consider location/radiation/quality/duration/timing/severity/associated sxs/prior Treatment) Patient is a 39 y.o. female presenting with leg pain. The history is provided by the patient. No language interpreter was used.  Leg Pain Location:  Leg Leg location:  L lower leg Pain details:    Quality:  Aching   Radiates to:  Does not radiate   Onset quality:  Unable to specify   Duration:  5 days   Timing:  Constant   Progression:  Unchanged Chronicity:  New Dislocation: no   Foreign body present:  No foreign bodies Prior injury to area:  No Relieved by:  Nothing Worsened by:  Activity Ineffective treatments:  Rest Associated symptoms: no back pain, no decreased ROM, no fatigue, no fever, no itching, no neck pain, no numbness, no stiffness, no swelling and no tingling   Associated symptoms comment:  No CP no SOB Risk factors: no concern for non-accidental trauma, no frequent fractures, no known bone disorder, no obesity and no recent illness   Risk factors comment:  No history of PE, DVT, bleeding or clotting disorders, travel history, no estrogen use.  No smoking   Past Medical History  Diagnosis Date  . Protein in urine   . Renal disorder    History reviewed. No pertinent past surgical history. No family history on file. History  Substance Use Topics  . Smoking status: Never Smoker   . Smokeless tobacco: Not on file  . Alcohol Use: No   OB History    No data available     Review of Systems  Constitutional: Negative for fever, chills, diaphoresis, activity change, appetite change and fatigue.  HENT: Negative for congestion, facial swelling, rhinorrhea and sore throat.   Eyes: Negative for photophobia and discharge.  Respiratory: Negative for cough, chest tightness and shortness of breath.    Cardiovascular: Negative for chest pain, palpitations and leg swelling.  Gastrointestinal: Negative for nausea, vomiting, abdominal pain and diarrhea.  Endocrine: Negative for polydipsia and polyuria.  Genitourinary: Negative for dysuria, frequency, difficulty urinating and pelvic pain.  Musculoskeletal: Negative for back pain, arthralgias, stiffness, neck pain and neck stiffness.  Skin: Negative for color change, itching and wound.  Allergic/Immunologic: Negative for immunocompromised state.  Neurological: Negative for facial asymmetry, weakness, numbness and headaches.  Hematological: Does not bruise/bleed easily.  Psychiatric/Behavioral: Negative for confusion and agitation.      Allergies  Review of patient's allergies indicates no known allergies.  Home Medications   Prior to Admission medications   Medication Sig Start Date End Date Taking? Authorizing Provider  cholecalciferol (VITAMIN D) 1000 UNITS tablet Take 1,000 Units by mouth daily.   Yes Historical Provider, MD  lisinopril (PRINIVIL,ZESTRIL) 40 MG tablet Take 40 mg by mouth daily.   Yes Historical Provider, MD  XARELTO STARTER PACK 15 & 20 MG TBPK Take 15-20 mg by mouth as directed. Take as directed on package: Start with one 15mg  tablet by mouth twice a day with food. On Day 22, switch to one 20mg  tablet once a day with food. 08/25/14   Toy Cookey, MD   BP 115/77 mmHg  Pulse 62  Temp(Src) 98.3 F (36.8 C) (Oral)  Resp 16  Ht 5\' 2"  (1.575 m)  Wt 162 lb (73.483 kg)  BMI 29.62 kg/m2  SpO2 100%  LMP 07/25/2014 Physical Exam  Constitutional: She is oriented to person, place, and time. She appears well-developed and well-nourished. No distress.  HENT:  Head: Normocephalic and atraumatic.  Mouth/Throat: No oropharyngeal exudate.  Eyes: Pupils are equal, round, and reactive to light.  Neck: Normal range of motion. Neck supple.  Cardiovascular: Normal rate, regular rhythm and normal heart sounds.  Exam reveals no  gallop and no friction rub.   No murmur heard. Pulmonary/Chest: Effort normal and breath sounds normal. No respiratory distress. She has no wheezes. She has no rales.  Abdominal: Soft. Bowel sounds are normal. She exhibits no distension and no mass. There is no tenderness. There is no rebound and no guarding.  Musculoskeletal: Normal range of motion. She exhibits no edema.       Left lower leg: She exhibits tenderness. She exhibits no swelling, no edema, no deformity and no laceration.       Legs: Neurological: She is alert and oriented to person, place, and time.  Skin: Skin is warm and dry.  Psychiatric: She has a normal mood and affect.    ED Course  Procedures (including critical care time) Labs Review Labs Reviewed  CBC WITH DIFFERENTIAL/PLATELET - Abnormal; Notable for the following:    Hemoglobin 11.9 (*)    HCT 35.6 (*)    Eosinophils Relative 9 (*)    All other components within normal limits  BASIC METABOLIC PANEL - Abnormal; Notable for the following:    BUN 25 (*)    Creatinine, Ser 1.64 (*)    GFR calc non Af Amer 39 (*)    GFR calc Af Amer 45 (*)    All other components within normal limits    Imaging Review US Venous Img Lower Unilateral Left  08/25/2014   CLINICAL DATA:  39 year old female with a history of left calf swelling.  EXAM: LEFT LOWER EXTREMITY VENOUS DOPPLER ULTRASOUND  TECHNIQUE: Gray-scale sonography with graded compression, as well as color Doppler and duplex ultrasound were performed to evaluate the lower extremity deep venous systems from the level of the common femoral vein and including the common femoral, femoral, profunda femoral, popliteal and calf veins including the posterior tibial, peroneal and gastrocnemius veins when visible. The superficial great saphenous vein was also interrogated. Spectral Doppler was utilized to evaluate flow at rest and with distal augmentation maneuvers in the common femoral, femoral and popliteal veins.  COMPARISON:   None.  FINDINGS: Contralateral Common Femoral Vein: Respiratory phasicity is normal and symmetric with the symptomatic side. No evidence of thrombus. Normal compressibility.  Common Femoral Vein: No evidence of thrombus. Normal compressibility, respiratory phasicity and response to augmentation.  Saphenofemoral Junction: No evidence of thrombus. Normal compressibility and flow on color Doppler imaging.  Profunda Femoral Vein: No evidence of thrombus. Normal compressibility and flow on color Doppler imaging.  Femoral Vein: No evidence of thrombus. Normal compressibility, respiratory phasicity and response to augmentation.  Popliteal Vein: No evidence of thrombus. Normal compressibility, respiratory phasicity and response to augmentation.  Calf Veins: Acute thrombus of 1 of the paired peroneal veins from below knee to above the ankle.  Superficial Great Saphenous Vein: No evidence of thrombus. Normal compressibility and flow on color Doppler imaging.  Other Findings:  None.  IMPRESSION: Sonographic survey of the left lower extremity negative for DVT of the common femoral vein, femoral vein, popliteal vein. There is acute occlusive DVT involving 1 of the 2 paired peroneal veins below the knee to the ankle.  Signed,  Yvone Neu.  Loreta Ave, DO  [facility]  [facility]   Electronically Signed   By: Gilmer Mor D.O.   On: 08/25/2014 13:06     EKG Interpretation None      MDM   Final diagnoses:  DVT (deep venous thrombosis), left    Pt is a 39 y.o. female with Pmhx as above who presents with 5 days of L calf pain w/o known injury, no swelling, redness fevers. Also denies CP, SOB, cough. No travel hx, smoking, or estrogen use. She has localized ttp to mid left calf, no palpable cords, no overlying skin changes. CBC, BMP with mild CKI, K nml. DVT study positive for L calf DVT. After discussion w/ pt & pharmacy, pt will be started on Xarelto & asked to f/u with PCP for  coagulopathy w/u.      Rashad Obeid Larivee evaluation in the Emergency Department is complete. It has been determined that no acute conditions requiring further emergency intervention are present at this time. The patient/guardian have been advised of the diagnosis and plan. We have discussed signs and symptoms that warrant return to the ED, such as changes or worsening in symptoms, CP, SOB, worsening swelling, bleeding.       Toy Cookey, MD 08/26/14 [id number]

## 2016-05-13 IMAGING — US US EXTREM LOW VENOUS*L*
1 series · 13 of 24 positions shown · non-contrast
Comparison: None.

CLINICAL DATA: 39-year-old female with a history of left calf
swelling.



[Series 1: us extrem low venous*left* · 0.06mm/px · 13 of 42 slices shown]
[im 1/42]
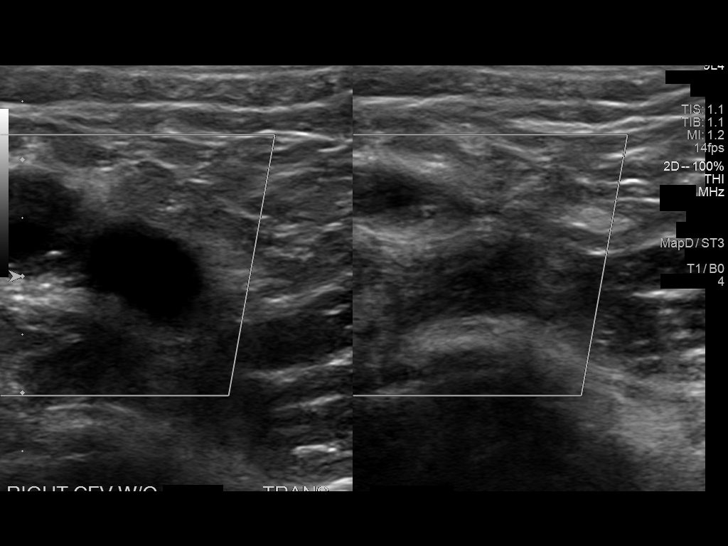
[im 4/42]
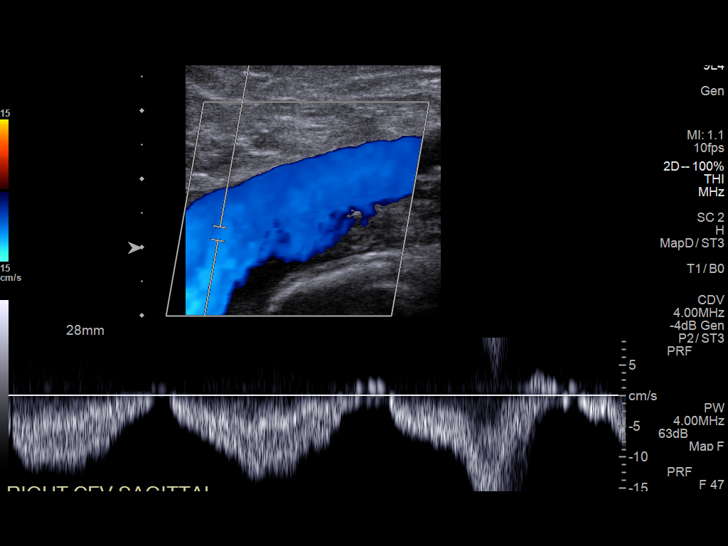
[im 8/42]
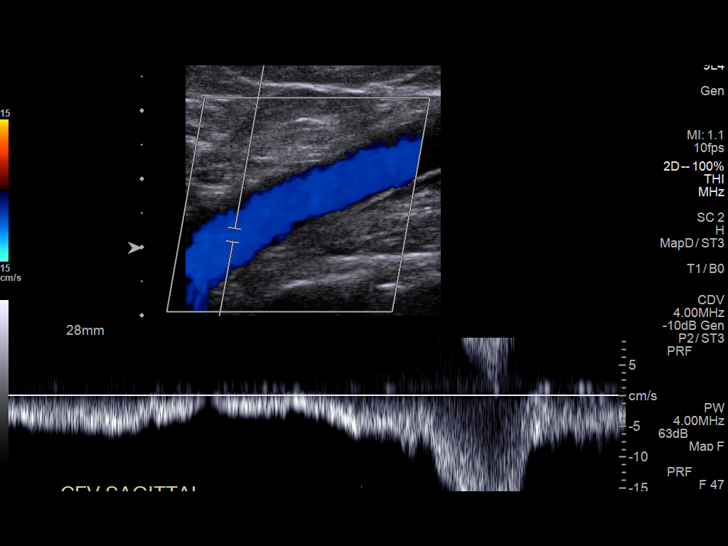
[im 11/42]
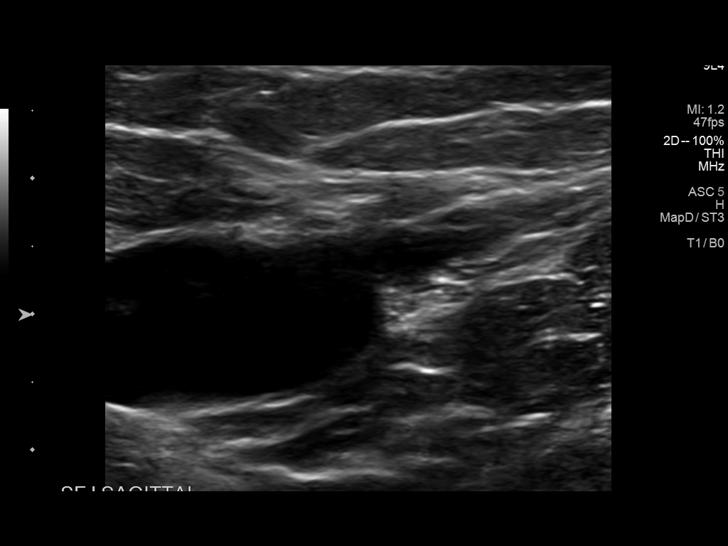
[im 15/42]
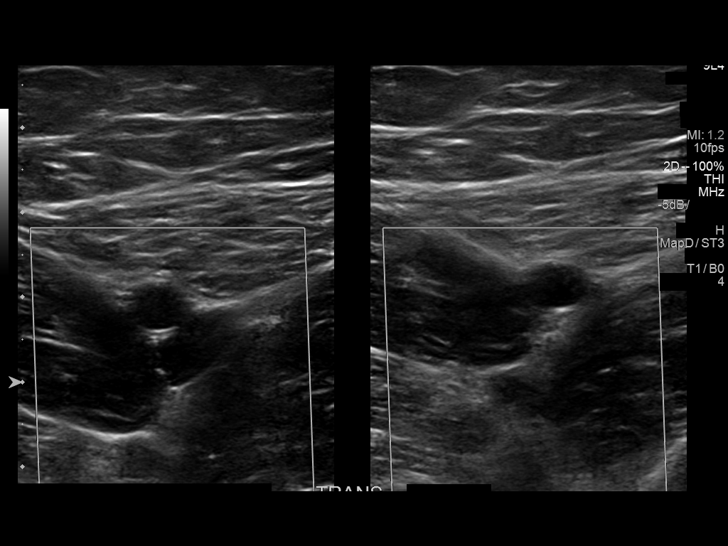
[im 18/42]
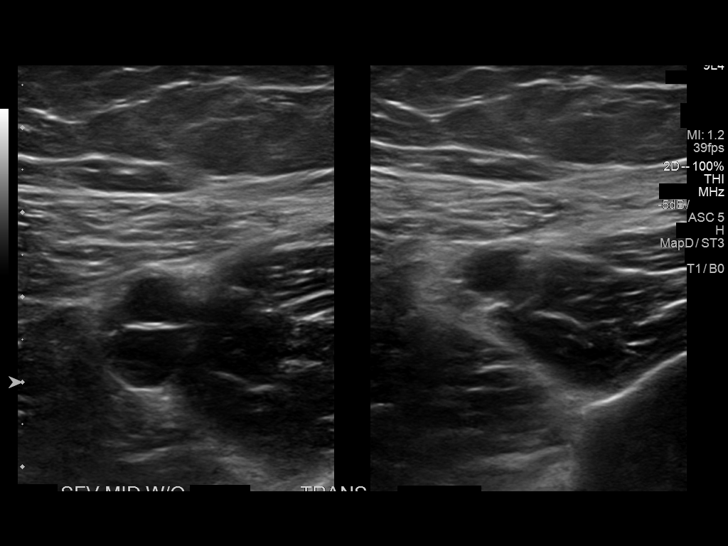
[im 22/42]
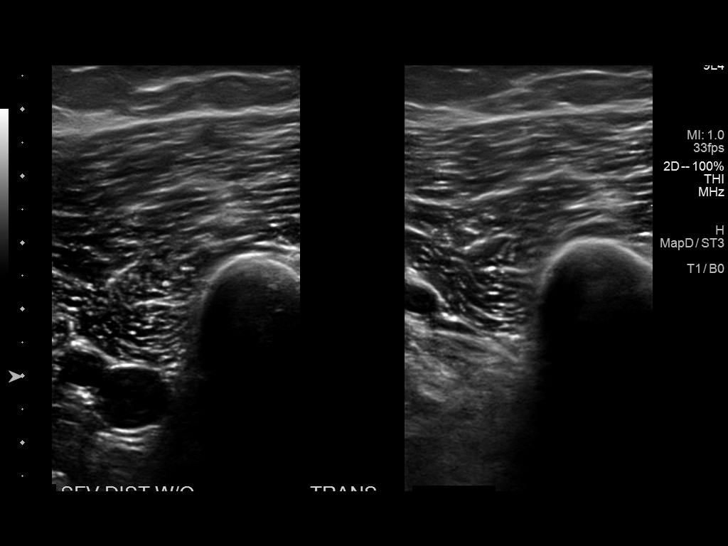
[im 24/42]
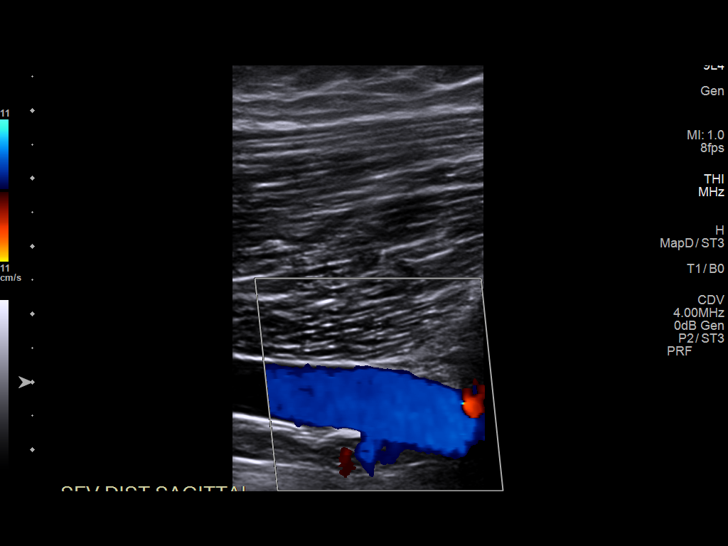
[im 27/42]
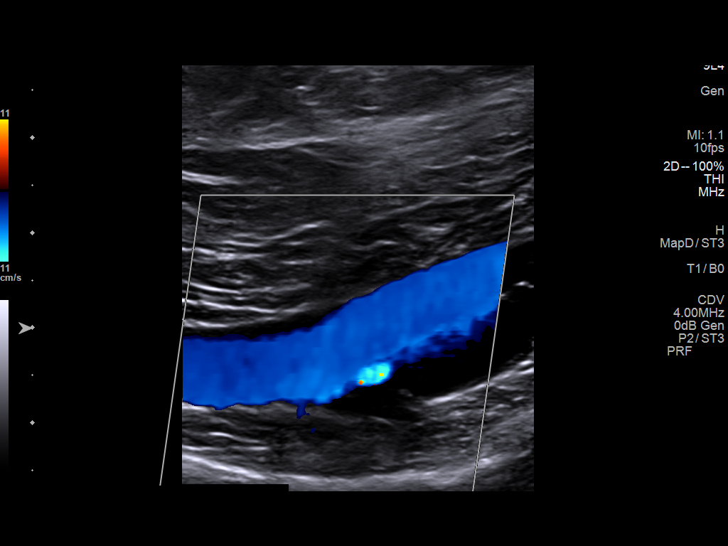
[im 31/42]
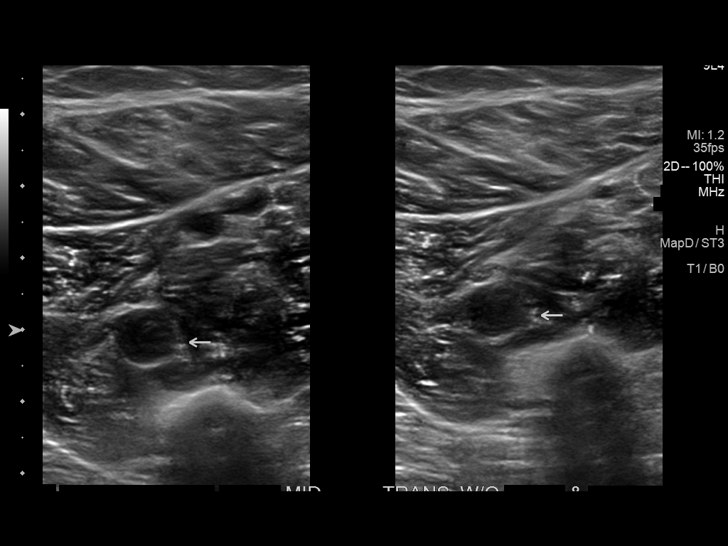
[im 34/42]
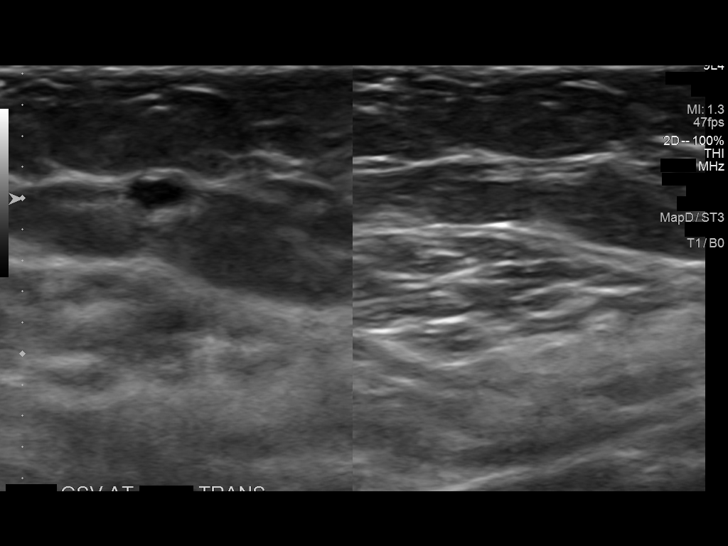
[im 38/42]
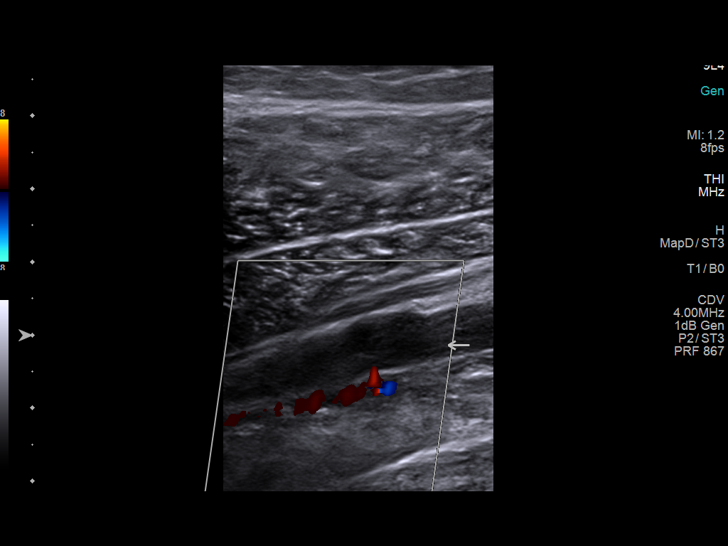
[im 42/42]
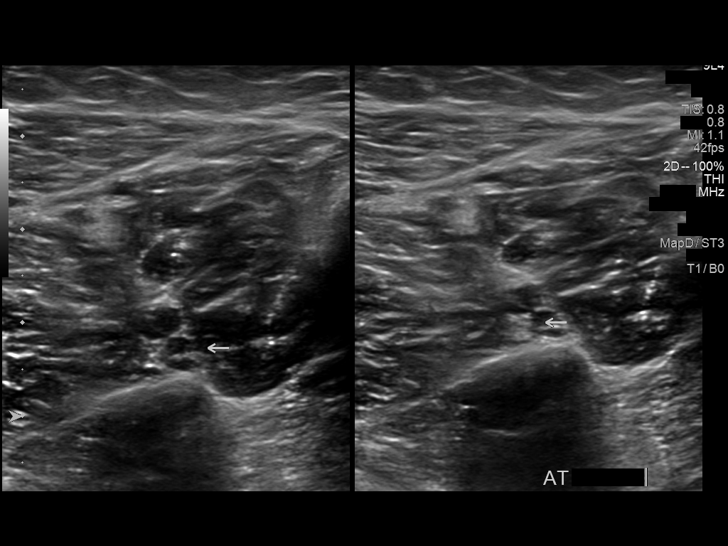

[13 of 24 positions shown; findings below may reference images not displayed]

FINDINGS: Contralateral Common Femoral Vein: Respiratory phasicity is normal
and symmetric with the symptomatic side. No evidence of thrombus.
Normal compressibility.

Common Femoral Vein: No evidence of thrombus. Normal
compressibility, respiratory phasicity and response to augmentation.

Saphenofemoral Junction: No evidence of thrombus. Normal
compressibility and flow on color Doppler imaging.

Profunda Femoral Vein: No evidence of thrombus. Normal
compressibility and flow on color Doppler imaging.

Femoral Vein: No evidence of thrombus. Normal compressibility,
respiratory phasicity and response to augmentation.

Popliteal Vein: No evidence of thrombus. Normal compressibility,
respiratory phasicity and response to augmentation.

Calf Veins: Acute thrombus of 1 of the paired peroneal veins from
below knee to above the ankle.

Superficial Great Saphenous Vein: No evidence of thrombus. Normal
compressibility and flow on color Doppler imaging.

Other Findings:  None.
IMPRESSION: Sonographic survey of the left lower extremity negative for DVT of
the common femoral vein, femoral vein, popliteal vein. There is
acute occlusive DVT involving 1 of the 2 paired peroneal veins below
the knee to the ankle.

## 2017-07-09 ENCOUNTER — Other Ambulatory Visit: Payer: Self-pay

## 2017-07-09 ENCOUNTER — Encounter (HOSPITAL_BASED_OUTPATIENT_CLINIC_OR_DEPARTMENT_OTHER): Payer: Self-pay | Admitting: Emergency Medicine

## 2017-07-09 DIAGNOSIS — Z79899 Other long term (current) drug therapy: Secondary | ICD-10-CM | POA: Insufficient documentation

## 2017-07-09 DIAGNOSIS — K5900 Constipation, unspecified: Secondary | ICD-10-CM | POA: Diagnosis present

## 2017-07-09 NOTE — ED Triage Notes (Signed)
Pt presents with constipation for 1 week. PT took miralax today and dulcolax suppository tonight

## 2017-07-10 ENCOUNTER — Emergency Department (HOSPITAL_BASED_OUTPATIENT_CLINIC_OR_DEPARTMENT_OTHER)
Admission: EM | Admit: 2017-07-10 | Discharge: 2017-07-10 | Disposition: A | Discharge status: Home / Self Care | Payer: BC Managed Care – PPO | Attending: Emergency Medicine | Admitting: Emergency Medicine

## 2017-07-10 DIAGNOSIS — K5909 Other constipation: Secondary | ICD-10-CM

## 2017-07-10 NOTE — Discharge Instructions (Signed)
I recommend that you increase your water and fiber intake. If you are not able to eat foods high in fiber, you may use Benefiber or Metamucil over-the-counter. I also recommend you use MiraLAX 1-2 times a day and Colace 100 mg twice a day to help with bowel movements. These medications are over the counter.  You may use other over-the-counter medications such as Dulcolax, Fleet enemas, magnesium citrate as needed for constipation. Please note that some of these medications may cause you to have abdominal cramping which is normal. If you develop severe abdominal pain, fever, vomiting, distention of your abdomen, unable to have a bowel movement for 5 days or are not passing gas, please return to the hospital. ° °

## 2017-07-10 NOTE — ED Notes (Signed)
Pt had large BM after enema.

## 2017-07-10 NOTE — ED Provider Notes (Signed)
TIME SEEN: 2:26 AM  CHIEF COMPLAINT: Constipation  HPI: Patient is a 42 year old female who presents to the emergency department with constipation.  Reports has not had normal bowel movement in 1 week.  No abdominal pain or distention.  No nausea or vomiting.  No MiraLAX and suppository at home once today without relief.  She is passing gas.  No history of bowel obstruction.  ROS: See HPI Constitutional: no fever  Eyes: no drainage  ENT: no runny nose   Cardiovascular:  no chest pain  Resp: no SOB  GI: no vomiting GU: no dysuria Integumentary: no rash  Allergy: no hives  Musculoskeletal: no leg swelling  Neurological: no slurred speech ROS otherwise negative  PAST MEDICAL HISTORY/PAST SURGICAL HISTORY:  Past Medical History:  Diagnosis Date  . Protein in urine   . Renal disorder     MEDICATIONS:  Prior to Admission medications   Medication Sig Start Date End Date Taking? Authorizing Provider  cholecalciferol (VITAMIN D) 1000 UNITS tablet Take 1,000 Units by mouth daily.    [provider]  lisinopril (PRINIVIL,ZESTRIL) 40 MG tablet Take 40 mg by mouth daily.    [provider]  XARELTO STARTER PACK 15 & 20 MG TBPK Take 15-20 mg by mouth as directed. Take as directed on package: Start with one 15mg  tablet by mouth twice a day with food. On Day 22, switch to one 20mg  tablet once a day with food. 08/25/14   Toy Cookey, MD    ALLERGIES:  No Known Allergies  SOCIAL HISTORY:  Social History   Tobacco Use  . Smoking status: Never Smoker  Substance Use Topics  . Alcohol use: No    FAMILY HISTORY: No family history on file.  EXAM: BP (!) 143/99 (BP Location: Left Arm)   Pulse 84   Temp 98.5 F (36.9 C) (Oral)   Resp 18   Ht 5\' 2"  (1.575 m)   Wt 77.1 kg (170 lb)   LMP 06/18/2017   SpO2 98%   BMI 31.09 kg/m  CONSTITUTIONAL: Alert and oriented and responds appropriately to questions. Well-appearing; well-nourished HEAD: Normocephalic EYES:  Conjunctivae clear, pupils appear equal, EOMI ENT: normal nose; moist mucous membranes NECK: Supple, no meningismus, no nuchal rigidity, no LAD  CARD: RRR; S1 and S2 appreciated; no murmurs, no clicks, no rubs, no gallops RESP: Normal chest excursion without splinting or tachypnea; breath sounds clear and equal bilaterally; no wheezes, no rhonchi, no rales, no hypoxia or respiratory distress, speaking full sentences ABD/GI: Normal bowel sounds; non-distended; soft, non-tender, no rebound, no guarding, no peritoneal signs, no hepatosplenomegaly RECTAL:  Normal, no hemorrhoids appreciated, nontender rectal exam, patient does have a fecal impaction BACK:  The back appears normal and is non-tender to palpation, there is no CVA tenderness EXT: Normal ROM in all joints; non-tender to palpation; no edema; normal capillary refill; no cyanosis, no calf tenderness or swelling    SKIN: Normal color for age and race; warm; no rash NEURO: Moves all extremities equally PSYCH: The patient's mood and manner are appropriate. Grooming and personal hygiene are appropriate.  MEDICAL DECISION MAKING: Patient here with constipation.  She does have impaction on exam.  Given soapsuds enema and has had multiple bowel movements reports feeling "much better".  Abdominal exam benign.  No distention, tympany or fluid wave.  No vomiting.  Doubt bowel obstruction.  Recommended increased water and fiber intake at home, Colace and MiraLAX.  I feel she is safe for discharge home.  At this  time, I do not feel there is any life-threatening condition present. I have reviewed and discussed all results (EKG, imaging, lab, urine as appropriate) and exam findings with patient/family. I have reviewed nursing notes and appropriate previous records.  I feel the patient is safe to be discharged home without further emergent workup and can continue workup as an outpatient as needed. Discussed usual and customary return precautions. Patient/family  verbalize understanding and are comfortable with this plan.  Outpatient follow-up has been provided if needed. All questions have been answered.      Jakayden Cancio, Layla Maw, DO 07/10/17 [phone]

## 2019-08-10 ENCOUNTER — Telehealth: Payer: Self-pay | Admitting: Hematology and Oncology

## 2019-08-10 ENCOUNTER — Other Ambulatory Visit: Payer: BC Managed Care – PPO

## 2019-08-10 NOTE — Telephone Encounter (Signed)
Received a new hem referral from Dr. Ottis Stain. Pt is requesting a 2nd opinion for dvt. MS. Fernholz has been cld and scheduled to see Dr. Leonides Schanz on 4/19 at 2pm. Pt .aware to arrive 15 minutes early.

## 2019-08-28 ENCOUNTER — Other Ambulatory Visit: Payer: Self-pay

## 2019-08-28 ENCOUNTER — Encounter: Payer: Self-pay | Admitting: Hematology and Oncology

## 2019-08-28 ENCOUNTER — Inpatient Hospital Stay: Payer: BC Managed Care – PPO

## 2019-08-28 ENCOUNTER — Inpatient Hospital Stay: Payer: BC Managed Care – PPO | Attending: Hematology and Oncology | Admitting: Hematology and Oncology

## 2019-08-28 VITALS — BP 122/88 | HR 77 | Temp 97.4°F | Resp 18 | Ht 62.0 in | Wt 164.9 lb

## 2019-08-28 DIAGNOSIS — N028 Recurrent and persistent hematuria with other morphologic changes: Secondary | ICD-10-CM | POA: Insufficient documentation

## 2019-08-28 DIAGNOSIS — Z86718 Personal history of other venous thrombosis and embolism: Secondary | ICD-10-CM | POA: Insufficient documentation

## 2019-08-28 DIAGNOSIS — I82402 Acute embolism and thrombosis of unspecified deep veins of left lower extremity: Secondary | ICD-10-CM

## 2019-08-28 DIAGNOSIS — Z7901 Long term (current) use of anticoagulants: Secondary | ICD-10-CM | POA: Insufficient documentation

## 2019-08-28 DIAGNOSIS — Z79899 Other long term (current) drug therapy: Secondary | ICD-10-CM | POA: Diagnosis not present

## 2019-08-28 DIAGNOSIS — Z8249 Family history of ischemic heart disease and other diseases of the circulatory system: Secondary | ICD-10-CM | POA: Diagnosis not present

## 2019-08-28 DIAGNOSIS — Z833 Family history of diabetes mellitus: Secondary | ICD-10-CM | POA: Insufficient documentation

## 2019-08-28 LAB — CBC WITH DIFFERENTIAL (CANCER CENTER ONLY)
Abs Immature Granulocytes: 0.02 10*3/uL (ref 0.00–0.07)
Basophils Absolute: 0 10*3/uL (ref 0.0–0.1)
Basophils Relative: 1 %
Eosinophils Absolute: 0.1 10*3/uL (ref 0.0–0.5)
Eosinophils Relative: 1 %
HCT: 33.2 % — ABNORMAL LOW (ref 36.0–46.0)
Hemoglobin: 10.9 g/dL — ABNORMAL LOW (ref 12.0–15.0)
Immature Granulocytes: 0 %
Lymphocytes Relative: 23 %
Lymphs Abs: 1.7 10*3/uL (ref 0.7–4.0)
MCH: 28.8 pg (ref 26.0–34.0)
MCHC: 32.8 g/dL (ref 30.0–36.0)
MCV: 87.8 fL (ref 80.0–100.0)
Monocytes Absolute: 0.4 10*3/uL (ref 0.1–1.0)
Monocytes Relative: 6 %
Neutro Abs: 5.3 10*3/uL (ref 1.7–7.7)
Neutrophils Relative %: 69 %
Platelet Count: 239 10*3/uL (ref 150–400)
RBC: 3.78 MIL/uL — ABNORMAL LOW (ref 3.87–5.11)
RDW: 13.1 % (ref 11.5–15.5)
WBC Count: 7.5 10*3/uL (ref 4.0–10.5)
nRBC: 0 % (ref 0.0–0.2)

## 2019-08-28 LAB — CMP (CANCER CENTER ONLY)
ALT: 10 U/L (ref 0–44)
AST: 16 U/L (ref 15–41)
Albumin: 3.5 g/dL (ref 3.5–5.0)
Alkaline Phosphatase: 47 U/L (ref 38–126)
Anion gap: 5 (ref 5–15)
BUN: 24 mg/dL — ABNORMAL HIGH (ref 6–20)
CO2: 23 mmol/L (ref 22–32)
Calcium: 8.9 mg/dL (ref 8.9–10.3)
Chloride: 111 mmol/L (ref 98–111)
Creatinine: 1.54 mg/dL — ABNORMAL HIGH (ref 0.44–1.00)
GFR, Est AFR Am: 47 mL/min — ABNORMAL LOW (ref >60)
GFR, Estimated: 41 mL/min — ABNORMAL LOW (ref >60)
Glucose, Bld: 92 mg/dL (ref 70–99)
Potassium: 4.6 mmol/L (ref 3.5–5.1)
Sodium: 139 mmol/L (ref 135–145)
Total Bilirubin: 0.2 mg/dL — ABNORMAL LOW (ref 0.3–1.2)
Total Protein: 6.6 g/dL (ref 6.5–8.1)

## 2019-08-28 MED ORDER — APIXABAN 5 MG PO TABS: 5.0000 mg | ORAL_TABLET | Freq: Two times a day (BID) | ORAL | 6 refills | Status: DC

## 2019-08-28 NOTE — Progress Notes (Signed)
[facility] [phone]   [phone]) [phone]  INITIAL CONSULT NOTE  Patient Care Team: Daylene Katayama, [place] as PCP - General (Family Medicine)  Hematological/Oncological History # Recurrent Unprovoked Left Lower Extremity DVT  1) 08/25/2014: patient presented with left calf swelling and was found to have acute occlusive DVT involving 1 of the 2 paired peroneal veins below the knee to the ankle on LE [name]. Unprovoked.  2) 08/25/14-01/17/2015: Xarelto therapy.  3) 07/20/2017: diagnosed with a second left lower extremity unprovoked DVT. US showed evidence of DVT of one of the two LEFT posterior tibial  veins, probably acute. 4) 07/20/2017-present: Xarelto, but transitioned to apixaban 5mg  BID Nov 2019 5) 08/28/2019: establish care with Dr. 08/30/2019   CHIEF COMPLAINTS/PURPOSE OF CONSULTATION:  "Recurrent Unprovoked Left Lower Extremity DVT  "  HISTORY OF PRESENTING ILLNESS:  Holly Owens 44 y.o. female with medical history significant for recurrent DVTs and IgA nephropathy who presents for second opinion regarding the treatment of her recurrent DVTs.   On review of the previous records Holly Owens was initially diagnosed with a left lower extremity DVT on 08/25/2014 when she presented with left calf swelling.  She was noted to have DVT in the peroneal veins at that time.  The blood clot was determined to be unprovoked.  She was on Xarelto therapy for approximately 5 months time from 4/16 2 01/17/2015.  She was in her normal state of health until 07/20/2017 at which time she was diagnosed with a second left lower extremity unprovoked DVT.  At that time she was started on Xarelto therapy, but quickly transitioned back to apixaban therapy 5 mg twice daily in November 2019.  She has been on the apixaban therapy since that time.  She presents today for a second opinion regarding the treatment of her lower extremity DVT.  On exam today Holly Owens notes that she feels well.  She  reports that initially when she was on the rivaroxaban she had "terrible" bleeding and very heavy menstrual cycles.  She notes that she continues to have some increase in menstrual bleeding on anticoagulation therapy, but that this is much improved with apixaban therapy as compared to Xarelto.  On discussion today she notes that she is not having any symptoms in her lower extremities consistent with recurrent or residual VTE.  She denies having any nosebleeds, dark stools, though she does have some occasional light bruising on her extremities.  On further discussion the patient notes that she was diagnosed with IgA nephropathy shortly before she began having these episodes of VTE.  She also notes that she has had markedly elevated blood pressure as result of the kidney disease.  The patient is a non-smoker and has not used any birth control pills.  Of note the patient also reports that she has never had any injury to her left lower extremity including fracture or tendon tear.  The patient currently denies any fevers, chills, sweats, nausea, vomiting or diarrhea.  A full 10 point ROS is listed below.  MEDICAL HISTORY:  Past Medical History:  Diagnosis Date  . Protein in urine   . Renal disorder     SURGICAL HISTORY: History reviewed. No pertinent surgical history.  SOCIAL HISTORY: Social History   Socioeconomic History  . Marital status: Married    Spouse name: Not on file  . Number of children: Not on file  . Years of education: Not on file  . Highest education level: Not on file  Occupational History  .  Not on file  Tobacco Use  . Smoking status: Never Smoker  . Smokeless tobacco: Never Used  Substance and Sexual Activity  . Alcohol use: No  . Drug use: No  . Sexual activity: Yes    Birth control/protection: None  Other Topics Concern  . Not on file  Social History Narrative  . Not on file   Social Determinants of Health   Financial Resource Strain:   . Difficulty of Paying  Living Expenses:   Food Insecurity:   . Worried About Charity fundraiser in the Last Year:   . Arboriculturist in the Last Year:   Transportation Needs:   . Film/video editor (Medical):   Marland Kitchen Lack of Transportation (Non-Medical):   Physical Activity:   . Days of Exercise per Week:   . Minutes of Exercise per Session:   Stress:   . Feeling of Stress :   Social Connections:   . Frequency of Communication with Friends and Family:   . Frequency of Social Gatherings with Friends and Family:   . Attends Religious Services:   . Active Member of Clubs or Organizations:   . Attends Archivist Meetings:   Marland Kitchen Marital Status:   Intimate Partner Violence:   . Fear of Current or Ex-Partner:   . Emotionally Abused:   Marland Kitchen Physically Abused:   . Sexually Abused:     FAMILY HISTORY: Family History  Problem Relation Age of Onset  . Diabetes Mother   . Hypertension Mother   . Healthy Father   . Healthy Sister   . Healthy Brother   . Hypertension Maternal Aunt   . Diabetes Maternal Aunt     ALLERGIES:  has No Known Allergies.  MEDICATIONS:  Current Outpatient Medications  Medication Sig Dispense Refill  . apixaban (ELIQUIS) 5 MG TABS tablet Take 1 tablet (5 mg total) by mouth 2 (two) times daily. 60 tablet 6  . Ascorbic Acid (VITAMIN C) 500 MG CAPS Take 1 capsule by mouth daily.    . cetirizine (ZYRTEC) 10 MG tablet Take 10 mg by mouth as needed for allergies.    . Omega-3 Fatty Acids (FISH OIL) 1000 MG CAPS Take 1 capsule by mouth 3 (three) times daily.    . cholecalciferol (VITAMIN D) 1000 UNITS tablet Take 1,000 Units by mouth daily.    Marland Kitchen lisinopril (PRINIVIL,ZESTRIL) 40 MG tablet Take 40 mg by mouth daily.     No current facility-administered medications for this visit.    REVIEW OF SYSTEMS:   Constitutional: ( - ) fevers, ( - )  chills , ( - ) night sweats Eyes: ( - ) blurriness of vision, ( - ) double vision, ( - ) watery eyes Ears, nose, mouth, throat, and face: (  - ) mucositis, ( - ) sore throat Respiratory: ( - ) cough, ( - ) dyspnea, ( - ) wheezes Cardiovascular: ( - ) palpitation, ( - ) chest discomfort, ( - ) lower extremity swelling Gastrointestinal:  ( - ) nausea, ( - ) heartburn, ( - ) change in bowel habits Skin: ( - ) abnormal skin rashes Lymphatics: ( - ) new lymphadenopathy, ( - ) easy bruising Neurological: ( - ) numbness, ( - ) tingling, ( - ) new weaknesses Behavioral/Psych: ( - ) mood change, ( - ) new changes  All other systems were reviewed with the patient and are negative.  PHYSICAL EXAMINATION: ECOG PERFORMANCE STATUS: 0 - Asymptomatic  Vitals:  08/28/19 1406  BP: 122/88  Pulse: 77  Resp: 18  Temp: (!) 97.4 F (36.3 C)  SpO2: 100%   Filed Weights   08/28/19 1406  Weight: 164 lb 14.4 oz (74.8 kg)    GENERAL: well appearing middle aged [place] American female in NAD  SKIN: skin color, texture, turgor are normal, no rashes or significant lesions EYES: conjunctiva are pink and non-injected, sclera clear LUNGS: clear to auscultation and percussion with normal breathing effort HEART: regular rate & rhythm and no murmurs and no lower extremity edema Musculoskeletal: no cyanosis of digits and no clubbing  PSYCH: alert & oriented x 3, fluent speech NEURO: no focal motor/sensory deficits  LABORATORY DATA:  I have reviewed the data as listed CBC Latest Ref Rng & Units 08/28/2019 08/25/2014 01/07/2012  WBC 4.0 - 10.5 K/uL 7.5 7.2 8.2  Hemoglobin 12.0 - 15.0 g/dL 10.9(L) 11.9(L) 14.1  Hematocrit 36.0 - 46.0 % 33.2(L) 35.6(L) 39.9  Platelets 150 - 400 K/uL 239 184 234    CMP Latest Ref Rng & Units 08/28/2019 08/25/2014 01/07/2012  Glucose 70 - 99 mg/dL 92 99 093(O)  BUN 6 - 20 mg/dL 67(T) 24(P) 21  Creatinine 0.44 - 1.00 mg/dL 8.09(X) 8.33(A) 2.50(N)  Sodium 135 - 145 mmol/L 139 137 138  Potassium 3.5 - 5.1 mmol/L 4.6 4.7 3.7  Chloride 98 - 111 mmol/L 111 109 103  CO2 22 - 32 mmol/L 23 23 26   Calcium 8.9 - 10.3 mg/dL 8.9  8.8 9.0  Total Protein 6.5 - 8.1 g/dL 6.6 - -  Total Bilirubin 0.3 - 1.2 mg/dL ) - -  Alkaline Phos 38 - 126 U/L 47 - -  AST 15 - 41 U/L 16 - -  ALT 0 - 44 U/L 10 - -   PATHOLOGY: None relevant to review.   RADIOGRAPHIC STUDIES: I have personally reviewed the radiological images as listed and agreed with the findings in the report. No results found.  ASSESSMENT & PLAN Holly Owens 44 y.o. female with medical history significant for recurrent DVTs and IgA nephropathy who presents for second opinion regarding the treatment of her recurrent DVTs.  After review of the prior records and discussion with the patient her findings are consistent with recurrent unprovoked VTE's.  There was extensive hypercoagulation work-up performed in the [facility] system on 08/25/2014.  The patient was not found to have any genetic mutations such as factor V Leiden, prothrombin gene mutation, and no other abnormality such as antiphospholipid antibody syndrome, protein C deficiency, protein S deficiency, or Antithrombin III deficiency.  Overall I would note that the patient was diagnosed with IgA nephropathy shortly before developing these recurrent DVTs.  It is well-known that patients with high levels of proteinuria are often susceptible to developing VTE's.  The etiology of this is thought to be the loss of anticoagulant proteins through excessive loss through the kidney.  I recommend lifelong anticoagulation given these unprovoked DVTs and the likelihood that the IgA nephropathy is a possible cause that is irreversible.  I noted to the patient that the reason for lifelong anticoagulation is to prevent further VTE's, with concern that a more serious blood clot such as a pulmonary embolism or a stroke could occur.  The patient voiced her understanding of these findings and was agreeable to continuing anticoagulation therapy.  In the event the patient were to develop concerning symptoms with bleeding we  could reconsider and rediscuss anticoagulation therapy.  The time being I would recommend that  she continue apixaban 5 mg twice daily with a follow-up visit with [name] in approximately 6 months time.  # Recurrent Unprovoked Left Lower Extremity DVT  --agree with prior hematologist that indefinite anticoagulation with apixaban is appropriate for the patient at this time --will refill the patient's apixaban 5mg  BID today  --no evidence of recurrent VTE or PE today. Strict return precautions for symptoms of increased swelling, leg pain, shortness of breath, or chest pain --patient has requested her care be transferred to [facility]. We are happy to be her primary hematology service --RTC in 6 months to re-evaluate.    #IgA Nephropathy --it is possible that the patient's coagulopathy is related to protein loss from her IgA nephropathy with heavy proteinuria --defer to nephrology for management --baseline CMP and CBC today.   Orders Placed This Encounter  Procedures  . CBC with Differential (Cancer Center Only)    Standing Status:   Future    Number of Occurrences:   1    Standing Expiration Date:   08/27/2020  . CMP (Cancer Center only)    Standing Status:   Future    Number of Occurrences:   1    Standing Expiration Date:   08/27/2020    All questions were answered. The patient knows to call the clinic with any problems, questions or concerns.  A total of more than 60 minutes were spent on this encounter and over half of that time was spent on counseling and coordination of care as outlined above.   08/29/2020, MD Department of Hematology/Oncology [facility] at [facility] Phone: [phone] Pager: [phone] Email: [email or web address]  08/28/2019 5:05 PM   Tang 08/30/2019. A case of IgA nephropathy with deep venous thrombosis in the mesentery and lower extremities. [facility] Med Surg. 2018 Dec;8(11):1123-1128.  --IgA  nephropathy is associated with an increased risk of deep venous thrombosis (DVT)

## 2019-08-30 ENCOUNTER — Telehealth: Payer: Self-pay | Admitting: Hematology and Oncology

## 2019-08-30 NOTE — Telephone Encounter (Signed)
Scheduled per los. Called and left msg. Mailed printout  °

## 2020-02-26 NOTE — Progress Notes (Signed)
[facility] [phone]   Fax:(336) [phone]  PROGRESS NOTE  Patient Care Team: Daylene Katayama, [place] as PCP - General (Family Medicine)  Hematological/Oncological History # Recurrent Unprovoked Left Lower Extremity DVT  1) 08/25/2014: patient presented with left calf swelling and was found to have acute occlusive DVT involving 1 of the 2 paired peroneal veins below the knee to the ankle on LE [name]. Unprovoked.  2) 08/25/14-01/17/2015: Xarelto therapy.  3) 07/20/2017: diagnosed with a second left lower extremity unprovoked DVT. US showed evidence of DVT of one of the two LEFT posterior tibial  veins, probably acute. 4) 07/20/2017-present: Xarelto, but transitioned to apixaban 5mg  BID Nov 2019 5) 08/28/2019: establish care with Dr. 08/30/2019   Interval History:  Holly Owens 44 y.o. female with medical history significant for recurrent unprovoked LLE DVT who presents for a follow up visit. The patient's last visit was on 08/28/2019 at which time she established care. In the interim since the last visit she has had no changes in her health no ED or emergency room visits.  On exam today [name] reports that she has been well in the interim since her last visit.  She reports that she has had no major changes in her medication except for a drop in the dosage of lisinopril to 20 mg p.o. daily.  She notes she has been faithfully taking her Eliquis and has had no issues with bleeding. She does have a baseline level of bruising and does have a small bruise on her right arm, but notes that she was this way prior to starting Xarelto therapy.  She is had no cuts, scrapes, or bumps.  She is also noticed no dark bowel movements or blood in her stool or urine.  She also notes that her menstrual cycles have "lightened up some" since she transitioned from Xarelto to Eliquis.  She also has been following up closely with her nephrologist and notes that her kidney function has been stable.   Full 10 point ROS is listed below.  MEDICAL HISTORY:  Past Medical History:  Diagnosis Date  . Protein in urine   . Renal disorder     SURGICAL HISTORY: No past surgical history on file.  SOCIAL HISTORY: Social History   Socioeconomic History  . Marital status: Married    Spouse name: Not on file  . Number of children: Not on file  . Years of education: Not on file  . Highest education level: Not on file  Occupational History  . Not on file  Tobacco Use  . Smoking status: Never Smoker  . Smokeless tobacco: Never Used  Substance and Sexual Activity  . Alcohol use: No  . Drug use: No  . Sexual activity: Yes    Birth control/protection: None  Other Topics Concern  . Not on file  Social History Narrative  . Not on file   Social Determinants of Health   Financial Resource Strain:   . Difficulty of Paying Living Expenses: Not on file  Food Insecurity:   . Worried About 08/30/2019 in the Last Year: Not on file  . Ran Out of Food in the Last Year: Not on file  Transportation Needs:   . Lack of Transportation (Medical): Not on file  . Lack of Transportation (Non-Medical): Not on file  Physical Activity:   . Days of Exercise per Week: Not on file  . Minutes of Exercise per Session: Not on file  Stress:   .  Feeling of Stress : Not on file  Social Connections:   . Frequency of Communication with Friends and Family: Not on file  . Frequency of Social Gatherings with Friends and Family: Not on file  . Attends Religious Services: Not on file  . Active Member of Clubs or Organizations: Not on file  . Attends Banker Meetings: Not on file  . Marital Status: Not on file  Intimate Partner Violence:   . Fear of Current or Ex-Partner: Not on file  . Emotionally Abused: Not on file  . Physically Abused: Not on file  . Sexually Abused: Not on file    FAMILY HISTORY: Family History  Problem Relation Age of Onset  . Diabetes Mother   . Hypertension  Mother   . Healthy Father   . Healthy Sister   . Healthy Brother   . Hypertension Maternal Aunt   . Diabetes Maternal Aunt     ALLERGIES:  has No Known Allergies.  MEDICATIONS:  Current Outpatient Medications  Medication Sig Dispense Refill  . apixaban (ELIQUIS) 5 MG TABS tablet Take 1 tablet (5 mg total) by mouth 2 (two) times daily. 60 tablet 6  . Ascorbic Acid (VITAMIN C) 500 MG CAPS Take 1 capsule by mouth daily.    . cetirizine (ZYRTEC) 10 MG tablet Take 10 mg by mouth as needed for allergies.    . cholecalciferol (VITAMIN D) 1000 UNITS tablet Take 1,000 Units by mouth daily.    Marland Kitchen lisinopril (PRINIVIL,ZESTRIL) 40 MG tablet Take 40 mg by mouth daily.    . Omega-3 Fatty Acids (FISH OIL) 1000 MG CAPS Take 1 capsule by mouth 3 (three) times daily.     No current facility-administered medications for this visit.    REVIEW OF SYSTEMS:   Constitutional: ( - ) fevers, ( - )  chills , ( - ) night sweats Eyes: ( - ) blurriness of vision, ( - ) double vision, ( - ) watery eyes Ears, nose, mouth, throat, and face: ( - ) mucositis, ( - ) sore throat Respiratory: ( - ) cough, ( - ) dyspnea, ( - ) wheezes Cardiovascular: ( - ) palpitation, ( - ) chest discomfort, ( - ) lower extremity swelling Gastrointestinal:  ( - ) nausea, ( - ) heartburn, ( - ) change in bowel habits Skin: ( - ) abnormal skin rashes Lymphatics: ( - ) new lymphadenopathy, ( - ) easy bruising Neurological: ( - ) numbness, ( - ) tingling, ( - ) new weaknesses Behavioral/Psych: ( - ) mood change, ( - ) new changes  All other systems were reviewed with the patient and are negative.  PHYSICAL EXAMINATION:  Vitals:   02/28/20 1524  BP: 115/80  Pulse: 70  Resp: 16  Temp: (!) 97.4 F (36.3 C)  SpO2: 100%   Filed Weights   02/28/20 1524  Weight: 164 lb 6.4 oz (74.6 kg)    GENERAL: well appearing middle aged [place] American female. alert, no distress and comfortable SKIN: skin color, texture, turgor are normal,  no rashes or significant lesions EYES: conjunctiva are pink and non-injected, sclera clear LUNGS: clear to auscultation and percussion with normal breathing effort HEART: regular rate & rhythm and no murmurs and no lower extremity edema Musculoskeletal: no cyanosis of digits and no clubbing  PSYCH: alert & oriented x 3, fluent speech NEURO: no focal motor/sensory deficits  LABORATORY DATA:  I have reviewed the data as listed CBC Latest Ref Rng & Units 02/28/2020 08/28/2019  08/25/2014  WBC 4.0 - 10.5 K/uL 7.0 7.5 7.2  Hemoglobin 12.0 - 15.0 g/dL 4.1(P) 10.9(L) 11.9(L)  Hematocrit 36 - 46 % 30.1(L) 33.2(L) 35.6(L)  Platelets 150 - 400 K/uL 243 239 184    CMP Latest Ref Rng & Units 02/28/2020 08/28/2019 08/25/2014  Glucose 70 - 99 mg/dL 93 92 99  BUN 6 - 20 mg/dL 37(T) 02(I) 09(B)  Creatinine 0.44 - 1.00 mg/dL 3.53(G) 9.92(E) 2.68(T)  Sodium 135 - 145 mmol/L 136 139 137  Potassium 3.5 - 5.1 mmol/L 4.6 4.6 4.7  Chloride 98 - 111 mmol/L 106 111 109  CO2 22 - 32 mmol/L 23 23 23   Calcium 8.9 - 10.3 mg/dL 9.1 8.9 8.8  Total Protein 6.5 - 8.1 g/dL 6.7 6.6 -  Total Bilirubin 0.3 - 1.2 mg/dL ) <4.1(D) -  Alkaline Phos 38 - 126 U/L 47 47 -  AST 15 - 41 U/L 14(L) 16 -  ALT 0 - 44 U/L 8 10 -    RADIOGRAPHIC STUDIES: No results found.  ASSESSMENT & PLAN Holly Owens 44 y.o. female with medical history significant for recurrent unprovoked LLE DVT who presents for a follow up visit.   On exam today Ms. movement has unfortunately had a drop in her hemoglobin from 10.9-9.7.  She is not had any overt bleeding to help explain this, and it may be secondary to worsening renal function. There may be component of iron deficiency anemia giving the microcytosis.  We were unaware that this would be an issue prior to her presentation and therefore iron studies were not ordered, we were able to add on a reticulocyte panel to evaluate this further.  I recommend lifelong anticoagulation given these  unprovoked DVTs and the likelihood that the IgA nephropathy is a possible cause that is irreversible.  I noted to the patient that the reason for lifelong anticoagulation is to prevent further VTE's, with concern that a more serious blood clot such as a pulmonary embolism or a stroke could occur.  The patient voiced her understanding of these findings and was agreeable to continuing anticoagulation therapy.  In the event the patient were to develop concerning symptoms with bleeding we could reconsider and rediscuss anticoagulation therapy.  The time being I would recommend that she continue apixaban 5 mg twice daily with a follow-up visit with 56 in approximately 6 months time.  # Recurrent Unprovoked Left Lower Extremity DVT  --agree with prior hematologist that indefinite anticoagulation with apixaban is appropriate for the patient at this time --continue apixaban 5mg  BID --no evidence of recurrent VTE or PE today. Strict return precautions for symptoms of increased swelling, leg pain, shortness of breath, or chest pain --patient has requested her care be transferred to [facility]. We are happy to be her primary hematology service --RTC in 6 months to re-evaluate.    #Microcytic Anemia --Hgb 9.7 today, decreased from Hgb 10.9 at prior visit --mild microcytosis at 77 --requested iron studies, ferritin, and reticulocyte count.  --may be 2/2 to poor erythropoietin production from kidney disease.   #IgA Nephropathy --it is possible that the patient's coagulopathy is related to protein loss from her IgA nephropathy with heavy proteinuria --defer to nephrology for management --baseline CMP and CBC today.   Orders Placed This Encounter  Procedures  . Ferritin    Standing Status:   Future    Standing Expiration Date:   02/27/2021  . Iron and TIBC    Standing Status:   Future    Standing  Expiration Date:   02/27/2021  . Retic Panel   All questions were answered. The patient knows to call the clinic  with any problems, questions or concerns.  A total of more than 30 minutes were spent on this encounter and over half of that time was spent on counseling and coordination of care as outlined above.   Ulysees Barns, MD Department of Hematology/Oncology [facility] at [facility] Phone: [phone] Pager: [phone] Email: [email or web address]  02/28/2020 4:03 PM

## 2020-02-27 ENCOUNTER — Other Ambulatory Visit: Payer: Self-pay | Admitting: Hematology and Oncology

## 2020-02-27 DIAGNOSIS — I82402 Acute embolism and thrombosis of unspecified deep veins of left lower extremity: Secondary | ICD-10-CM

## 2020-02-28 ENCOUNTER — Inpatient Hospital Stay: Payer: BC Managed Care – PPO

## 2020-02-28 ENCOUNTER — Encounter: Payer: Self-pay | Admitting: Hematology and Oncology

## 2020-02-28 ENCOUNTER — Inpatient Hospital Stay: Payer: BC Managed Care – PPO | Attending: Hematology and Oncology | Admitting: Hematology and Oncology

## 2020-02-28 ENCOUNTER — Other Ambulatory Visit: Payer: Self-pay

## 2020-02-28 VITALS — BP 115/80 | HR 70 | Temp 97.4°F | Resp 16 | Ht 62.0 in | Wt 164.4 lb

## 2020-02-28 DIAGNOSIS — D500 Iron deficiency anemia secondary to blood loss (chronic): Secondary | ICD-10-CM | POA: Diagnosis not present

## 2020-02-28 DIAGNOSIS — Z7901 Long term (current) use of anticoagulants: Secondary | ICD-10-CM | POA: Insufficient documentation

## 2020-02-28 DIAGNOSIS — N028 Recurrent and persistent hematuria with other morphologic changes: Secondary | ICD-10-CM | POA: Diagnosis not present

## 2020-02-28 DIAGNOSIS — I82502 Chronic embolism and thrombosis of unspecified deep veins of left lower extremity: Secondary | ICD-10-CM | POA: Insufficient documentation

## 2020-02-28 DIAGNOSIS — I82402 Acute embolism and thrombosis of unspecified deep veins of left lower extremity: Secondary | ICD-10-CM

## 2020-02-28 DIAGNOSIS — D509 Iron deficiency anemia, unspecified: Secondary | ICD-10-CM | POA: Insufficient documentation

## 2020-02-28 DIAGNOSIS — R809 Proteinuria, unspecified: Secondary | ICD-10-CM | POA: Diagnosis not present

## 2020-02-28 DIAGNOSIS — Z79899 Other long term (current) drug therapy: Secondary | ICD-10-CM | POA: Diagnosis not present

## 2020-02-28 LAB — CMP (CANCER CENTER ONLY)
ALT: 8 U/L (ref 0–44)
AST: 14 U/L — ABNORMAL LOW (ref 15–41)
Albumin: 3.6 g/dL (ref 3.5–5.0)
Alkaline Phosphatase: 47 U/L (ref 38–126)
Anion gap: 7 (ref 5–15)
BUN: 23 mg/dL — ABNORMAL HIGH (ref 6–20)
CO2: 23 mmol/L (ref 22–32)
Calcium: 9.1 mg/dL (ref 8.9–10.3)
Chloride: 106 mmol/L (ref 98–111)
Creatinine: 1.46 mg/dL — ABNORMAL HIGH (ref 0.44–1.00)
GFR, Estimated: 44 mL/min — ABNORMAL LOW (ref >60)
Glucose, Bld: 93 mg/dL (ref 70–99)
Potassium: 4.6 mmol/L (ref 3.5–5.1)
Sodium: 136 mmol/L (ref 135–145)
Total Bilirubin: <0.2 mg/dL — ABNORMAL LOW (ref 0.3–1.2)
Total Protein: 6.7 g/dL (ref 6.5–8.1)

## 2020-02-28 LAB — CBC WITH DIFFERENTIAL (CANCER CENTER ONLY)
Abs Immature Granulocytes: 0.02 10*3/uL (ref 0.00–0.07)
Basophils Absolute: 0 10*3/uL (ref 0.0–0.1)
Basophils Relative: 0 %
Eosinophils Absolute: 0.1 10*3/uL (ref 0.0–0.5)
Eosinophils Relative: 1 %
HCT: 30.1 % — ABNORMAL LOW (ref 36.0–46.0)
Hemoglobin: 9.7 g/dL — ABNORMAL LOW (ref 12.0–15.0)
Immature Granulocytes: 0 %
Lymphocytes Relative: 29 %
Lymphs Abs: 2.1 10*3/uL (ref 0.7–4.0)
MCH: 24.8 pg — ABNORMAL LOW (ref 26.0–34.0)
MCHC: 32.2 g/dL (ref 30.0–36.0)
MCV: 77 fL — ABNORMAL LOW (ref 80.0–100.0)
Monocytes Absolute: 0.6 10*3/uL (ref 0.1–1.0)
Monocytes Relative: 9 %
Neutro Abs: 4.2 10*3/uL (ref 1.7–7.7)
Neutrophils Relative %: 61 %
Platelet Count: 243 10*3/uL (ref 150–400)
RBC: 3.91 MIL/uL (ref 3.87–5.11)
RDW: 15.5 % (ref 11.5–15.5)
WBC Count: 7 10*3/uL (ref 4.0–10.5)
nRBC: 0 % (ref 0.0–0.2)

## 2020-02-28 LAB — RETIC PANEL
Immature Retic Fract: 14.4 % (ref 2.3–15.9)
RBC.: 3.93 MIL/uL (ref 3.87–5.11)
Retic Count, Absolute: 33.2 10*3/uL (ref 19.0–186.0)
Retic Ct Pct: 0.9 % (ref 0.4–3.1)
Reticulocyte Hemoglobin: 26.4 pg — ABNORMAL LOW (ref >27.9)

## 2020-03-05 ENCOUNTER — Telehealth: Payer: Self-pay | Admitting: Hematology and Oncology

## 2020-03-05 NOTE — Telephone Encounter (Signed)
Scheduled per los. Called and spoke with patient. Confirmed appt 

## 2020-04-01 ENCOUNTER — Other Ambulatory Visit: Payer: Self-pay | Admitting: Hematology and Oncology

## 2020-04-10 ENCOUNTER — Other Ambulatory Visit: Payer: Self-pay | Admitting: Hematology and Oncology

## 2020-04-10 MED ORDER — APIXABAN 5 MG PO TABS
5.0000 mg | ORAL_TABLET | Freq: Two times a day (BID) | ORAL | 5 refills | Status: DC
Start: 2020-04-10 — End: 2020-12-02

## 2020-05-08 ENCOUNTER — Other Ambulatory Visit: Payer: Self-pay | Admitting: *Deleted

## 2020-05-08 ENCOUNTER — Encounter: Payer: Self-pay | Admitting: Hematology and Oncology

## 2020-05-08 DIAGNOSIS — I82402 Acute embolism and thrombosis of unspecified deep veins of left lower extremity: Secondary | ICD-10-CM

## 2020-05-08 DIAGNOSIS — D500 Iron deficiency anemia secondary to blood loss (chronic): Secondary | ICD-10-CM

## 2020-05-17 ENCOUNTER — Encounter: Payer: Self-pay | Admitting: Hematology and Oncology

## 2020-05-31 ENCOUNTER — Other Ambulatory Visit: Payer: Self-pay | Admitting: Hematology and Oncology

## 2020-05-31 ENCOUNTER — Other Ambulatory Visit: Payer: BC Managed Care – PPO

## 2020-05-31 ENCOUNTER — Other Ambulatory Visit: Payer: Self-pay

## 2020-05-31 ENCOUNTER — Inpatient Hospital Stay: Payer: BC Managed Care – PPO | Attending: Hematology and Oncology | Admitting: Hematology and Oncology

## 2020-05-31 VITALS — BP 114/79 | HR 72 | Temp 97.0°F | Resp 20 | Ht 62.0 in | Wt 167.7 lb

## 2020-05-31 DIAGNOSIS — N028 Recurrent and persistent hematuria with other morphologic changes: Secondary | ICD-10-CM | POA: Diagnosis not present

## 2020-05-31 DIAGNOSIS — Z7901 Long term (current) use of anticoagulants: Secondary | ICD-10-CM | POA: Insufficient documentation

## 2020-05-31 DIAGNOSIS — Z79899 Other long term (current) drug therapy: Secondary | ICD-10-CM | POA: Insufficient documentation

## 2020-05-31 DIAGNOSIS — I82402 Acute embolism and thrombosis of unspecified deep veins of left lower extremity: Secondary | ICD-10-CM

## 2020-05-31 DIAGNOSIS — D509 Iron deficiency anemia, unspecified: Secondary | ICD-10-CM | POA: Diagnosis not present

## 2020-05-31 DIAGNOSIS — I82502 Chronic embolism and thrombosis of unspecified deep veins of left lower extremity: Secondary | ICD-10-CM | POA: Insufficient documentation

## 2020-05-31 NOTE — Progress Notes (Signed)
[facility] [phone]   [phone]  PROGRESS NOTE  Patient Care Team: Daylene Katayama, [place] as PCP - General (Family Medicine)  Hematological/Oncological History # Recurrent Unprovoked Left Lower Extremity DVT  1) 08/25/2014: patient presented with left calf swelling and was found to have acute occlusive DVT involving 1 of the 2 paired peroneal veins below the knee to the ankle on LE [name]. Unprovoked.  2) 08/25/14-01/17/2015: Xarelto therapy.  3) 07/20/2017: diagnosed with a second left lower extremity unprovoked DVT. US showed evidence of DVT of one of the two LEFT posterior tibial  veins, probably acute. 4) 07/20/2017-present: started on Xarelto, but transitioned to apixaban 5mg  BID Nov 2019 5) 08/28/2019: establish care with Dr. 08/30/2019   Interval History:  Holly Owens 45 y.o. female with medical history significant for recurrent unprovoked LLE DVT who presents for a follow up visit. The patient's last visit was on 02/28/2020 at which time she established care. In the interim since the last visit she has had no changes in her health no ED or emergency room visits.  On exam today Holly Owens that she has been well in the interim since her last visit.  She reports that she is concerned that her insurance company may longer be covering Eliquis and that she may be forced to transfer to Xarelto or Coumadin.  She notes that she did try Xarelto previously and had heavy menstrual bleeding and therefore had to transition over to Eliquis instead.  She notes that she has enough Eliquis to make it through to the end of February as she will be speaking with her insurance company in order to try to get this approved for her.  Otherwise Holly Owens notes that her energy level is good and that she has not been having any issues with heavy menstrual cycles, fatigue, or shortness of breath in the interim since her last visit.  She also has no evidence of residual or  recurrent DVT.  A full 10 point ROS is listed below.  MEDICAL HISTORY:  Past Medical History:  Diagnosis Date  . Protein in urine   . Renal disorder     SURGICAL HISTORY: History reviewed. No pertinent surgical history.  SOCIAL HISTORY: Social History   Socioeconomic History  . Marital status: Married    Spouse name: Not on file  . Number of children: Not on file  . Years of education: Not on file  . Highest education level: Not on file  Occupational History  . Not on file  Tobacco Use  . Smoking status: Never Smoker  . Smokeless tobacco: Never Used  Substance and Sexual Activity  . Alcohol use: No  . Drug use: No  . Sexual activity: Yes    Birth control/protection: None  Other Topics Concern  . Not on file  Social History Narrative  . Not on file   Social Determinants of Health   Financial Resource Strain: Not on file  Food Insecurity: Not on file  Transportation Needs: Not on file  Physical Activity: Not on file  Stress: Not on file  Social Connections: Not on file  Intimate Partner Violence: Not on file    FAMILY HISTORY: Family History  Problem Relation Age of Onset  . Diabetes Mother   . Hypertension Mother   . Healthy Father   . Healthy Sister   . Healthy Brother   . Hypertension Maternal Aunt   . Diabetes Maternal Aunt     ALLERGIES:  has No  Known Allergies.  MEDICATIONS:  Current Outpatient Medications  Medication Sig Dispense Refill  . lisinopril (ZESTRIL) 20 MG tablet TAKE 1 TABLET(20 MG) BY MOUTH DAILY    . apixaban (ELIQUIS) 5 MG TABS tablet Take 1 tablet (5 mg total) by mouth 2 (two) times daily. 60 tablet 5  . Ascorbic Acid (VITAMIN C) 500 MG CAPS Take 1 capsule by mouth daily.    . cetirizine (ZYRTEC) 10 MG tablet Take 10 mg by mouth as needed for allergies.    . cholecalciferol (VITAMIN D) 1000 UNITS tablet Take 1,000 Units by mouth daily.    . ferrous sulfate 325 (65 FE) MG tablet Take by mouth.    Marland Kitchen lisinopril (PRINIVIL,ZESTRIL)  40 MG tablet Take 40 mg by mouth daily.    . Omega-3 Fatty Acids (FISH OIL) 1000 MG CAPS Take 1 capsule by mouth 3 (three) times daily.     No current facility-administered medications for this visit.    REVIEW OF SYSTEMS:   Constitutional: ( - ) fevers, ( - )  chills , ( - ) night sweats Eyes: ( - ) blurriness of vision, ( - ) double vision, ( - ) watery eyes Ears, nose, mouth, throat, and face: ( - ) mucositis, ( - ) sore throat Respiratory: ( - ) cough, ( - ) dyspnea, ( - ) wheezes Cardiovascular: ( - ) palpitation, ( - ) chest discomfort, ( - ) lower extremity swelling Gastrointestinal:  ( - ) nausea, ( - ) heartburn, ( - ) change in bowel habits Skin: ( - ) abnormal skin rashes Lymphatics: ( - ) new lymphadenopathy, ( - ) easy bruising Neurological: ( - ) numbness, ( - ) tingling, ( - ) new weaknesses Behavioral/Psych: ( - ) mood change, ( - ) new changes  All other systems were reviewed with the patient and are negative.  PHYSICAL EXAMINATION:  Vitals:   05/31/20 1521  BP: 114/79  Pulse: 72  Resp: 20  Temp: (!) 97 F (36.1 C)  SpO2: 100%   Filed Weights   05/31/20 1521  Weight: 167 lb 11.2 oz (76.1 kg)    GENERAL: well appearing middle aged [place] American female. alert, no distress and comfortable SKIN: skin color, texture, turgor are normal, no rashes or significant lesions EYES: conjunctiva are pink and non-injected, sclera clear LUNGS: clear to auscultation and percussion with normal breathing effort HEART: regular rate & rhythm and no murmurs and no lower extremity edema Musculoskeletal: no cyanosis of digits and no clubbing  PSYCH: alert & oriented x 3, fluent speech NEURO: no focal motor/sensory deficits  LABORATORY DATA:  I have reviewed the data as listed CBC Latest Ref Rng & Units 02/28/2020 08/28/2019 08/25/2014  WBC 4.0 - 10.5 K/uL 7.0 7.5 7.2  Hemoglobin 12.0 - 15.0 g/dL 1.0(U) 10.9(L) 11.9(L)  Hematocrit 36.0 - 46.0 % 30.1(L) 33.2(L) 35.6(L)   Platelets 150 - 400 K/uL 243 239 184    CMP Latest Ref Rng & Units 02/28/2020 08/28/2019 08/25/2014  Glucose 70 - 99 mg/dL 93 92 99  BUN 6 - 20 mg/dL 72(Z) 36(U) 44(I)  Creatinine 0.44 - 1.00 mg/dL 3.47(Q) 2.59(D) 6.38(V)  Sodium 135 - 145 mmol/L 136 139 137  Potassium 3.5 - 5.1 mmol/L 4.6 4.6 4.7  Chloride 98 - 111 mmol/L 106 111 109  CO2 22 - 32 mmol/L 23 23 23   Calcium 8.9 - 10.3 mg/dL 9.1 8.9 8.8  Total Protein 6.5 - 8.1 g/dL 6.7 6.6 -  Total Bilirubin 0.3 -  1.2 mg/dL <5.3(I) 1.4(E) -  Alkaline Phos 38 - 126 U/L 47 47 -  AST 15 - 41 U/L 14(L) 16 -  ALT 0 - 44 U/L 8 10 -    RADIOGRAPHIC STUDIES: No results found.  ASSESSMENT & PLAN Holly Owens 45 y.o. female with medical history significant for recurrent unprovoked LLE DVT who presents for a follow up visit.   On exam today Ms. Willhelm is doing well.  Unfortunately there is some concern about whether or not her insurance company will continue to cover Eliquis and therefore we are looking into getting the special approved versus transitioning to Xarelto/Coumadin.  She is tolerating her anticoagulation therapy well and is willing/able to proceed with continued treatment.   I recommend lifelong anticoagulation given these unprovoked DVTs and the likelihood that the IgA nephropathy is a possible cause that is irreversible.  I noted to the patient that the reason for lifelong anticoagulation is to prevent further VTE's, with concern that a more serious blood clot such as a pulmonary embolism or a stroke could occur.  The patient voiced her understanding of these findings and was agreeable to continuing anticoagulation therapy.  In the event the patient were to develop concerning symptoms with bleeding we could reconsider and rediscuss anticoagulation therapy.  The time being I would recommend that she continue apixaban 5 mg twice daily with a follow-up visit with [name] in approximately 6 months time.  # Recurrent Unprovoked Left Lower  Extremity DVT  --agree with prior hematologist that indefinite anticoagulation with apixaban is appropriate for the patient at this time --continue apixaban 5mg  BID --no evidence of recurrent VTE or PE today. Strict return precautions for symptoms of increased swelling, leg pain, shortness of breath, or chest pain --patient has requested her care be transferred to [facility]. We are happy to be her primary hematology service --RTC in 6 months to re-evaluate.    #Microcytic Anemia --mild microcytosis at 77. Hgb improved to 11.0 at last check [facility] records) --requested iron studies, ferritin, and reticulocyte count. Patient gets her labs with [facility] due to cost.  --may be 2/2 to poor erythropoietin production from kidney disease.   #IgA Nephropathy --it is possible that the patient's coagulopathy is related to protein loss from her IgA nephropathy with heavy proteinuria --defer to nephrology for management  No orders of the defined types were placed in this encounter.  All questions were answered. The patient knows to call the clinic with any problems, questions or concerns.  A total of more than 30 minutes were spent on this encounter and over half of that time was spent on counseling and coordination of care as outlined above.   [facility], MD Department of Hematology/Oncology [facility] Phone: [phone] Pager: [phone] Email: [phone]  06/02/2020 4:33 PM

## 2020-06-02 ENCOUNTER — Encounter: Payer: Self-pay | Admitting: Hematology and Oncology

## 2020-06-03 ENCOUNTER — Encounter: Payer: Self-pay | Admitting: Hematology and Oncology

## 2020-06-04 ENCOUNTER — Telehealth: Payer: Self-pay | Admitting: Hematology and Oncology

## 2020-06-04 NOTE — Telephone Encounter (Signed)
scheduled appt per 1/23 sch message -called pt -  No answer. Mailed letter with appt date and time as well as left message.

## 2020-06-13 ENCOUNTER — Encounter: Payer: Self-pay | Admitting: Hematology and Oncology

## 2020-06-17 ENCOUNTER — Telehealth: Payer: Self-pay | Admitting: *Deleted

## 2020-06-17 NOTE — Telephone Encounter (Signed)
Received call from patient inquiring about the appeals process she has requested for continuing on her Eliquis.  She had sent a MyChart message asking for a letter outlining the need for her to stay on Eliquis be fax'd to [facility] @  [phone]  Please see MY Chart message from 06/03/20  Pt is asking to have this done as soon as possible.

## 2020-06-26 ENCOUNTER — Telehealth: Payer: Self-pay | Admitting: *Deleted

## 2020-06-26 ENCOUNTER — Encounter: Payer: Self-pay | Admitting: Hematology and Oncology

## 2020-06-26 ENCOUNTER — Encounter: Payer: Self-pay | Admitting: *Deleted

## 2020-06-26 NOTE — Telephone Encounter (Signed)
TCT patient regarding her questions about her iron studies and about the appeal to her insurance for the eliquis. spoke with her and advised that she should take her oral iron daily with a source of Vitamin C. She states she has recently started doing this daily, instead of just when she has her menstrual cycles.  She is taking it with a source of Vitamin C. Informed pt that a letter from Dr. Leonides Schanz has been fax'd to [facility] related to the appeal for her to continue her eliquis. Pt voiced understanding. No other questions or concerns.

## 2020-07-05 ENCOUNTER — Encounter: Payer: Self-pay | Admitting: Hematology and Oncology

## 2020-07-08 ENCOUNTER — Telehealth: Payer: Self-pay | Admitting: *Deleted

## 2020-07-08 NOTE — Telephone Encounter (Signed)
Received [employer] prior authorization request today.  Key: [id number], Patient: Holly Owens D.O.B: 1975-12-13 for Eliquis sent with recent office note.

## 2020-07-08 NOTE — Telephone Encounter (Signed)
Message left for collaborative regarding N/A response received for Eliquis per CoverMyMeds.

## 2020-11-11 ENCOUNTER — Encounter: Payer: Self-pay | Admitting: Hematology and Oncology

## 2020-11-12 ENCOUNTER — Other Ambulatory Visit: Payer: Self-pay | Admitting: *Deleted

## 2020-11-12 NOTE — Progress Notes (Signed)
Printed labs that are to be drawn for patient.

## 2020-12-02 ENCOUNTER — Other Ambulatory Visit: Payer: Self-pay

## 2020-12-02 ENCOUNTER — Other Ambulatory Visit: Payer: Self-pay | Admitting: Hematology and Oncology

## 2020-12-02 ENCOUNTER — Other Ambulatory Visit: Payer: BC Managed Care – PPO

## 2020-12-02 ENCOUNTER — Inpatient Hospital Stay: Payer: BC Managed Care – PPO

## 2020-12-02 ENCOUNTER — Inpatient Hospital Stay: Payer: BC Managed Care – PPO | Attending: Hematology and Oncology | Admitting: Hematology and Oncology

## 2020-12-02 VITALS — BP 122/87 | HR 76 | Temp 97.4°F | Resp 18 | Wt 173.6 lb

## 2020-12-02 DIAGNOSIS — Z86718 Personal history of other venous thrombosis and embolism: Secondary | ICD-10-CM | POA: Insufficient documentation

## 2020-12-02 DIAGNOSIS — Z833 Family history of diabetes mellitus: Secondary | ICD-10-CM | POA: Insufficient documentation

## 2020-12-02 DIAGNOSIS — I82502 Chronic embolism and thrombosis of unspecified deep veins of left lower extremity: Secondary | ICD-10-CM | POA: Insufficient documentation

## 2020-12-02 DIAGNOSIS — Z79899 Other long term (current) drug therapy: Secondary | ICD-10-CM | POA: Diagnosis not present

## 2020-12-02 DIAGNOSIS — M7989 Other specified soft tissue disorders: Secondary | ICD-10-CM | POA: Insufficient documentation

## 2020-12-02 DIAGNOSIS — Z7901 Long term (current) use of anticoagulants: Secondary | ICD-10-CM | POA: Diagnosis not present

## 2020-12-02 DIAGNOSIS — I82402 Acute embolism and thrombosis of unspecified deep veins of left lower extremity: Secondary | ICD-10-CM | POA: Diagnosis not present

## 2020-12-02 DIAGNOSIS — D509 Iron deficiency anemia, unspecified: Secondary | ICD-10-CM | POA: Diagnosis present

## 2020-12-02 DIAGNOSIS — N028 Recurrent and persistent hematuria with other morphologic changes: Secondary | ICD-10-CM | POA: Insufficient documentation

## 2020-12-02 DIAGNOSIS — R809 Proteinuria, unspecified: Secondary | ICD-10-CM | POA: Insufficient documentation

## 2020-12-02 DIAGNOSIS — Z8249 Family history of ischemic heart disease and other diseases of the circulatory system: Secondary | ICD-10-CM | POA: Insufficient documentation

## 2020-12-02 MED ORDER — APIXABAN 2.5 MG PO TABS: 2.5000 mg | ORAL_TABLET | Freq: Two times a day (BID) | ORAL | 2 refills | Status: DC

## 2020-12-02 NOTE — Progress Notes (Signed)
[facility] [phone]   [phone]  PROGRESS NOTE  Patient Care Team: Daylene Katayama, [place] as PCP - General (Family Medicine)  Hematological/Oncological History # Recurrent Unprovoked Left Lower Extremity DVT  1) 08/25/2014: patient presented with left calf swelling and was found to have acute occlusive DVT involving 1 of the 2 paired peroneal veins below the knee to the ankle on LE [name]. Unprovoked.  2) 08/25/14-01/17/2015: Xarelto therapy.  3) 07/20/2017: diagnosed with a second left lower extremity unprovoked DVT. US showed evidence of DVT of one of the two LEFT posterior tibial  veins, probably acute. 4) 07/20/2017-present: started on Xarelto, but transitioned to apixaban 5mg  BID Nov 2019 5) 08/28/2019: establish care with Dr. 08/30/2019  6) 12/02/2020: patient agreeable to transition to maintenance dose eliquis 2.5 mg BID.   Interval History:  Holly Owens 45 y.o. female with medical history significant for recurrent unprovoked LLE DVT who presents for a follow up visit. The patient's last visit was on 05/31/2020 at which time she established care. In the interim since the last visit she has had no changes in her health no ED or emergency room visits.  On exam today Mrs. Smalling reports that she has been well in the interim since her last visit.  She notes that she was having some discomfort in her right leg similar to the pain she had with her prior to DVTs.  She did have a an ultrasound performed which showed a Baker's cyst behind the knee.  She has been very compliant with her Eliquis therapy and maybe has had 1 or 2 late doses with none missed in a 24-hour period.  She reports no bleeding, bruising, or dark stools though she does endorse having heavier menstrual cycles while on the Eliquis.  She otherwise denies any fevers, chills, sweats, nausea, vomiting or diarrhea.   She also has no evidence of residual or recurrent DVT, no shortness of breath or chest  pain.  A full 10 point ROS is listed below.  MEDICAL HISTORY:  Past Medical History:  Diagnosis Date   Protein in urine    Renal disorder     SURGICAL HISTORY: No past surgical history on file.  SOCIAL HISTORY: Social History   Socioeconomic History   Marital status: Married    Spouse name: Not on file   Number of children: Not on file   Years of education: Not on file   Highest education level: Not on file  Occupational History   Not on file  Tobacco Use   Smoking status: Never   Smokeless tobacco: Never  Substance and Sexual Activity   Alcohol use: No   Drug use: No   Sexual activity: Yes    Birth control/protection: None  Other Topics Concern   Not on file  Social History Narrative   Not on file   Social Determinants of Health   Financial Resource Strain: Not on file  Food Insecurity: Not on file  Transportation Needs: Not on file  Physical Activity: Not on file  Stress: Not on file  Social Connections: Not on file  Intimate Partner Violence: Not on file    FAMILY HISTORY: Family History  Problem Relation Age of Onset   Diabetes Mother    Hypertension Mother    Healthy Father    Healthy Sister    Healthy Brother    Hypertension Maternal Aunt    Diabetes Maternal Aunt     ALLERGIES:  has No Known Allergies.  MEDICATIONS:  Current Outpatient Medications  Medication Sig Dispense Refill   apixaban (ELIQUIS) 2.5 MG TABS tablet Take 1 tablet (2.5 mg total) by mouth 2 (two) times daily. 180 tablet 2   Ascorbic Acid (VITAMIN C) 500 MG CAPS Take 1 capsule by mouth daily.     cetirizine (ZYRTEC) 10 MG tablet Take 10 mg by mouth as needed for allergies.     cholecalciferol (VITAMIN D) 1000 UNITS tablet Take 1,000 Units by mouth daily.     ferrous sulfate 325 (65 FE) MG tablet Take by mouth.     lisinopril (ZESTRIL) 20 MG tablet TAKE 1 TABLET(20 MG) BY MOUTH DAILY     Omega-3 Fatty Acids (FISH OIL) 1000 MG CAPS Take 1 capsule by mouth 3 (three) times  daily.     No current facility-administered medications for this visit.    REVIEW OF SYSTEMS:   Constitutional: ( - ) fevers, ( - )  chills , ( - ) night sweats Eyes: ( - ) blurriness of vision, ( - ) double vision, ( - ) watery eyes Ears, nose, mouth, throat, and face: ( - ) mucositis, ( - ) sore throat Respiratory: ( - ) cough, ( - ) dyspnea, ( - ) wheezes Cardiovascular: ( - ) palpitation, ( - ) chest discomfort, ( - ) lower extremity swelling Gastrointestinal:  ( - ) nausea, ( - ) heartburn, ( - ) change in bowel habits Skin: ( - ) abnormal skin rashes Lymphatics: ( - ) new lymphadenopathy, ( - ) easy bruising Neurological: ( - ) numbness, ( - ) tingling, ( - ) new weaknesses Behavioral/Psych: ( - ) mood change, ( - ) new changes  All other systems were reviewed with the patient and are negative.  PHYSICAL EXAMINATION:  Vitals:   12/02/20 1514  BP: 122/87  Pulse: 76  Resp: 18  Temp: (!) 97.4 F (36.3 C)  SpO2: 100%   Filed Weights   12/02/20 1514  Weight: 173 lb 9 oz (78.7 kg)    GENERAL: well appearing middle aged [place] American female. alert, no distress and comfortable SKIN: skin color, texture, turgor are normal, no rashes or significant lesions EYES: conjunctiva are pink and non-injected, sclera clear LUNGS: clear to auscultation and percussion with normal breathing effort HEART: regular rate & rhythm and no murmurs and no lower extremity edema Musculoskeletal: no cyanosis of digits and no clubbing  PSYCH: alert & oriented x 3, fluent speech NEURO: no focal motor/sensory deficits  LABORATORY DATA:  I have reviewed the data as listed CBC Latest Ref Rng & Units 02/28/2020 08/28/2019 08/25/2014  WBC 4.0 - 10.5 K/uL 7.0 7.5 7.2  Hemoglobin 12.0 - 15.0 g/dL 0.6(C) 10.9(L) 11.9(L)  Hematocrit 36.0 - 46.0 % 30.1(L) 33.2(L) 35.6(L)  Platelets 150 - 400 K/uL 243 239 184    CMP Latest Ref Rng & Units 02/28/2020 08/28/2019 08/25/2014  Glucose 70 - 99 mg/dL 93 92 99   BUN 6 - 20 mg/dL 37(S) 28(B) 15(V)  Creatinine 0.44 - 1.00 mg/dL 7.61(Y) 0.73(X) 1.06(Y)  Sodium 135 - 145 mmol/L 136 139 137  Potassium 3.5 - 5.1 mmol/L 4.6 4.6 4.7  Chloride 98 - 111 mmol/L 106 111 109  CO2 22 - 32 mmol/L 23 23 23   Calcium 8.9 - 10.3 mg/dL 9.1 8.9 8.8  Total Protein 6.5 - 8.1 g/dL 6.7 6.6 -  Total Bilirubin 0.3 - 1.2 mg/dL ) <6.9(S) -  Alkaline Phos 38 - 126 U/L 47 47 -  AST  15 - 41 U/L 14(L) 16 -  ALT 0 - 44 U/L 8 10 -    RADIOGRAPHIC STUDIES: No results found.  ASSESSMENT & PLAN Yeily Link Clover 45 y.o. female with medical history significant for recurrent unprovoked LLE DVT who presents for a follow up visit.   On exam today Ms. Risden is doing well.  Not having any issues with bleeding, bruising, or dark stools once medication.  Her insurance is currently covering it without difficulty.  I recommend lifelong anticoagulation given these unprovoked DVTs and the likelihood that the IgA nephropathy is a possible cause that is irreversible.  I noted to the patient that the reason for lifelong anticoagulation is to prevent further VTE's, with concern that a more serious blood clot such as a pulmonary embolism or a stroke could occur.  The patient voiced her understanding of these findings and was agreeable to continuing anticoagulation therapy.  In the event the patient were to develop concerning symptoms with bleeding we could reconsider and rediscuss anticoagulation therapy.  The time being I would recommend that she continue apixaban 5 mg twice daily with a follow-up visit with [name] in approximately 6 months time. After she completes her current   # Recurrent Unprovoked Left Lower Extremity DVT  --agree with prior hematologist that indefinite anticoagulation with apixaban is appropriate for the patient at this time --continue apixaban 5mg  BID. After she completes the current prescription we will transition to maintenance dose 2.5 mg BID  --no evidence of  recurrent VTE or PE today. Strict return precautions for symptoms of increased swelling, leg pain, shortness of breath, or chest pain --patient has requested her care be transferred to [facility]. We are happy to be her primary hematology service --RTC in 6 months to re-evaluate.    # Microcytic Anemia # Iron Deficiency Anemia 2/2 to GYN --Hgb improved to 12.7 at last check ([facility] records) --requested iron studies, ferritin, and reticulocyte count. Patient gets her labs with [facility] due to cost.  --ferritin improved to 37. Continue PO iron therapy  --continue to monitor    #IgA Nephropathy --it is possible that the patient's coagulopathy is related to protein loss from her IgA nephropathy with heavy proteinuria --defer to nephrology for management  No orders of the defined types were placed in this encounter.  All questions were answered. The patient knows to call the clinic with any problems, questions or concerns.  A total of more than 30 minutes were spent on this encounter and over half of that time was spent on counseling and coordination of care as outlined above.   [facility], MD Department of Hematology/Oncology [facility] at [facility] Phone: [phone] Pager: [phone] Email: [phone]  12/02/2020 5:14 PM

## 2020-12-05 ENCOUNTER — Telehealth: Payer: Self-pay | Admitting: Hematology and Oncology

## 2020-12-05 NOTE — Telephone Encounter (Signed)
Scheduled per los. Called and left msg. Mailed printout  °

## 2021-05-28 ENCOUNTER — Telehealth: Payer: Self-pay | Admitting: Hematology and Oncology

## 2021-05-28 NOTE — Telephone Encounter (Signed)
Sch per 1/18 inbasket,left msg °

## 2021-06-04 ENCOUNTER — Inpatient Hospital Stay: Payer: BC Managed Care – PPO

## 2021-06-04 ENCOUNTER — Inpatient Hospital Stay: Payer: BC Managed Care – PPO | Admitting: Hematology and Oncology

## 2021-06-10 ENCOUNTER — Encounter: Payer: Self-pay | Admitting: Hematology and Oncology

## 2021-06-10 ENCOUNTER — Other Ambulatory Visit: Payer: Self-pay | Admitting: Hematology and Oncology

## 2021-06-10 DIAGNOSIS — I82402 Acute embolism and thrombosis of unspecified deep veins of left lower extremity: Secondary | ICD-10-CM

## 2021-06-18 ENCOUNTER — Inpatient Hospital Stay: Payer: BC Managed Care – PPO | Attending: Hematology and Oncology | Admitting: Hematology and Oncology

## 2021-06-18 ENCOUNTER — Inpatient Hospital Stay: Payer: BC Managed Care – PPO

## 2021-06-18 ENCOUNTER — Other Ambulatory Visit: Payer: Self-pay

## 2021-06-18 VITALS — BP 120/87 | HR 63 | Temp 97.8°F | Resp 16 | Wt 174.9 lb

## 2021-06-18 DIAGNOSIS — I82502 Chronic embolism and thrombosis of unspecified deep veins of left lower extremity: Secondary | ICD-10-CM | POA: Insufficient documentation

## 2021-06-18 DIAGNOSIS — Z7901 Long term (current) use of anticoagulants: Secondary | ICD-10-CM | POA: Diagnosis not present

## 2021-06-18 DIAGNOSIS — D500 Iron deficiency anemia secondary to blood loss (chronic): Secondary | ICD-10-CM | POA: Diagnosis not present

## 2021-06-18 DIAGNOSIS — D509 Iron deficiency anemia, unspecified: Secondary | ICD-10-CM | POA: Insufficient documentation

## 2021-06-18 DIAGNOSIS — N028 Recurrent and persistent hematuria with other morphologic changes: Secondary | ICD-10-CM | POA: Insufficient documentation

## 2021-06-18 DIAGNOSIS — I82402 Acute embolism and thrombosis of unspecified deep veins of left lower extremity: Secondary | ICD-10-CM | POA: Diagnosis not present

## 2021-06-18 NOTE — Progress Notes (Signed)
[place] Telephone:(336) [phone]   Fax:(336) [phone]  PROGRESS NOTE  Patient Care Team: Virginia Rochester, [place] as PCP - General (Family Medicine)  Hematological/Oncological History # Recurrent Unprovoked Left Lower Extremity DVT  1) 08/25/2014: patient presented with left calf swelling and was found to have acute occlusive DVT involving 1 of the 2 paired peroneal veins below the knee to the ankle on LE [name]. Unprovoked.  2) 08/25/14-01/17/2015: Xarelto therapy.  3) 07/20/2017: diagnosed with a second left lower extremity unprovoked DVT. US showed evidence of DVT of one of the two LEFT posterior tibial  veins, probably acute. 4) 07/20/2017-present: started on Xarelto, but transitioned to apixaban 5mg  BID Nov 2019 5) 08/28/2019: establish care with Dr. Lorenso Courier  6) 12/02/2020: patient agreeable to transition to maintenance dose eliquis 2.5 mg BID.   Interval History:  Holly Owens 46 y.o. female with medical history significant for recurrent unprovoked LLE DVT who presents for a follow up visit. The patient's last visit was on 12/02/2020 at which time she established care. In the interim since the last visit she has had no changes in her health no ED or emergency room visits.  On exam today Holly Owens reports she has been well overall in the interim since our last visit.  She notes that she is tolerating the Eliquis therapy well with no issues of bleeding, bruising, or dark stools.  She also notes that she is tolerating the iron pills well without any stomach upset or constipation.  She notes that she likes to take half a pill per day and does not wish to take 1 pill every other day.  She notes her energy levels are good reporting that they are a 7 out of 10.  She otherwise denies any fevers, chills, sweats, nausea, vomiting or diarrhea.   She also has no evidence of residual or recurrent DVT, no shortness of breath or chest pain.  A full 10 point ROS is listed below.  MEDICAL  HISTORY:  Past Medical History:  Diagnosis Date   Protein in urine    Renal disorder     SURGICAL HISTORY: No past surgical history on file.  SOCIAL HISTORY: Social History   Socioeconomic History   Marital status: Married    Spouse name: Not on file   Number of children: Not on file   Years of education: Not on file   Highest education level: Not on file  Occupational History   Not on file  Tobacco Use   Smoking status: Never   Smokeless tobacco: Never  Substance and Sexual Activity   Alcohol use: No   Drug use: No   Sexual activity: Yes    Birth control/protection: None  Other Topics Concern   Not on file  Social History Narrative   Not on file   Social Determinants of Health   Financial Resource Strain: Not on file  Food Insecurity: Not on file  Transportation Needs: Not on file  Physical Activity: Not on file  Stress: Not on file  Social Connections: Not on file  Intimate Partner Violence: Not on file    FAMILY HISTORY: Family History  Problem Relation Age of Onset   Diabetes Mother    Hypertension Mother    Healthy Father    Healthy Sister    Healthy Brother    Hypertension Maternal Aunt    Diabetes Maternal Aunt     ALLERGIES:  has No Known Allergies.  MEDICATIONS:  Current Outpatient Medications  Medication Sig  Dispense Refill   apixaban (ELIQUIS) 2.5 MG TABS tablet Take 1 tablet (2.5 mg total) by mouth 2 (two) times daily. 180 tablet 2   Ascorbic Acid (VITAMIN C) 500 MG CAPS Take 1 capsule by mouth daily.     cetirizine (ZYRTEC) 10 MG tablet Take 10 mg by mouth as needed for allergies.     cholecalciferol (VITAMIN D) 1000 UNITS tablet Take 1,000 Units by mouth daily.     ferrous sulfate 325 (65 FE) MG tablet Take by mouth.     lisinopril (ZESTRIL) 20 MG tablet TAKE 1 TABLET(20 MG) BY MOUTH DAILY     Omega-3 Fatty Acids (FISH OIL) 1000 MG CAPS Take 1 capsule by mouth 3 (three) times daily.     No current facility-administered medications  for this visit.    REVIEW OF SYSTEMS:   Constitutional: ( - ) fevers, ( - )  chills , ( - ) night sweats Eyes: ( - ) blurriness of vision, ( - ) double vision, ( - ) watery eyes Ears, nose, mouth, throat, and face: ( - ) mucositis, ( - ) sore throat Respiratory: ( - ) cough, ( - ) dyspnea, ( - ) wheezes Cardiovascular: ( - ) palpitation, ( - ) chest discomfort, ( - ) lower extremity swelling Gastrointestinal:  ( - ) nausea, ( - ) heartburn, ( - ) change in bowel habits Skin: ( - ) abnormal skin rashes Lymphatics: ( - ) new lymphadenopathy, ( - ) easy bruising Neurological: ( - ) numbness, ( - ) tingling, ( - ) new weaknesses Behavioral/Psych: ( - ) mood change, ( - ) new changes  All other systems were reviewed with the patient and are negative.  PHYSICAL EXAMINATION:  Vitals:   06/18/21 1447  BP: 120/87  Pulse: 63  Resp: 16  Temp: 97.8 F (36.6 C)  SpO2: 100%   Filed Weights   06/18/21 1447  Weight: 174 lb 14.4 oz (79.3 kg)    GENERAL: well appearing middle aged [place] American female. alert, no distress and comfortable SKIN: skin color, texture, turgor are normal, no rashes or significant lesions EYES: conjunctiva are pink and non-injected, sclera clear LUNGS: clear to auscultation and percussion with normal breathing effort HEART: regular rate & rhythm and no murmurs and no lower extremity edema Musculoskeletal: no cyanosis of digits and no clubbing  PSYCH: alert & oriented x 3, fluent speech NEURO: no focal motor/sensory deficits  LABORATORY DATA:  I have reviewed the data as listed CBC Latest Ref Rng & Units 02/28/2020 08/28/2019 08/25/2014  WBC 4.0 - 10.5 K/uL 7.0 7.5 7.2  Hemoglobin 12.0 - 15.0 g/dL 9.7(L) 10.9(L) 11.9(L)  Hematocrit 36.0 - 46.0 % 30.1(L) 33.2(L) 35.6(L)  Platelets 150 - 400 K/uL 243 239 184    CMP Latest Ref Rng & Units 02/28/2020 08/28/2019 08/25/2014  Glucose 70 - 99 mg/dL 93 92 99  BUN 6 - 20 mg/dL 23(H) 24(H) 25(H)  Creatinine 0.44 - 1.00  mg/dL 1.46(H) 1.54(H) 1.64(H)  Sodium 135 - 145 mmol/L 136 139 137  Potassium 3.5 - 5.1 mmol/L 4.6 4.6 4.7  Chloride 98 - 111 mmol/L 106 111 109  CO2 22 - 32 mmol/L 23 23 23   Calcium 8.9 - 10.3 mg/dL 9.1 8.9 8.8  Total Protein 6.5 - 8.1 g/dL 6.7 6.6 -  Total Bilirubin 0.3 - 1.2 mg/dL <0.2(L) 0.2(L) -  Alkaline Phos 38 - 126 U/L 47 47 -  AST 15 - 41 U/L 14(L) 16 -  ALT  0 - 44 U/L 8 10 -    RADIOGRAPHIC STUDIES: No results found.  ASSESSMENT & PLAN Holly Owens 46 y.o. female with medical history significant for recurrent unprovoked LLE DVT who presents for a follow up visit.   On exam today Holly Owens is doing well.  Not having any issues with bleeding, bruising, or dark stools once medication.  Her insurance is currently covering it without difficulty.  I recommend lifelong anticoagulation given these unprovoked DVTs and the likelihood that the IgA nephropathy is a possible cause that is irreversible.  I noted to the patient that the reason for lifelong anticoagulation is to prevent further VTE's, with concern that a more serious blood clot such as a pulmonary embolism or a stroke could occur.  The patient voiced her understanding of these findings and was agreeable to continuing anticoagulation therapy.  In the event the patient were to develop concerning symptoms with bleeding we could reconsider and rediscuss anticoagulation therapy.  The time being I would recommend that she continue apixaban 5 mg twice daily with a follow-up visit with [name] in approximately 6 months time. After she completes her current   # Recurrent Unprovoked Left Lower Extremity DVT  --agree with prior hematologist that indefinite anticoagulation with apixaban is appropriate for the patient at this time --continue apixaban 5mg  BID. After she completes the current prescription we will transition to maintenance dose 2.5 mg BID  --no evidence of recurrent VTE or PE today. Strict return precautions for symptoms of  increased swelling, leg pain, shortness of breath, or chest pain --patient has requested her care be transferred to [facility]. We are happy to be her primary hematology service --RTC in 6 months to re-evaluate.    # Microcytic Anemia # Iron Deficiency Anemia 2/2 to GYN --Hgb in normal range at last check ([facility] records) --requested iron studies, ferritin, and reticulocyte count. Patient gets her labs with [facility] due to cost.  --ferritin improving. Continue PO iron therapy  --continue to monitor    #IgA Nephropathy --it is possible that the patient's coagulopathy is related to protein loss from her IgA nephropathy with heavy proteinuria --defer to nephrology for management  No orders of the defined types were placed in this encounter.   All questions were answered. The patient knows to call the clinic with any problems, questions or concerns.  A total of more than 30 minutes were spent on this encounter and over half of that time was spent on counseling and coordination of care as outlined above.   Ledell Peoples, MD Department of Hematology/Oncology [place] [facility] Phone: [phone] Pager: [phone] Email: [email or web address]  06/20/2021 10:33 AM

## 2021-08-21 ENCOUNTER — Other Ambulatory Visit: Payer: Self-pay | Admitting: Hematology and Oncology

## 2021-12-11 ENCOUNTER — Other Ambulatory Visit: Payer: Self-pay | Admitting: *Deleted

## 2021-12-11 ENCOUNTER — Telehealth: Payer: Self-pay | Admitting: Hematology and Oncology

## 2021-12-11 ENCOUNTER — Encounter: Payer: Self-pay | Admitting: Hematology and Oncology

## 2021-12-11 DIAGNOSIS — D500 Iron deficiency anemia secondary to blood loss (chronic): Secondary | ICD-10-CM

## 2021-12-11 NOTE — Telephone Encounter (Signed)
Per 8/3 phone line pt called to cancel lab appointment

## 2021-12-17 ENCOUNTER — Other Ambulatory Visit: Payer: Self-pay

## 2021-12-17 ENCOUNTER — Inpatient Hospital Stay: Payer: BC Managed Care – PPO | Attending: Hematology and Oncology | Admitting: Hematology and Oncology

## 2021-12-17 ENCOUNTER — Other Ambulatory Visit: Payer: BC Managed Care – PPO

## 2021-12-17 VITALS — BP 105/7 | HR 64 | Temp 98.7°F | Resp 18 | Ht 62.0 in | Wt 172.8 lb

## 2021-12-17 DIAGNOSIS — Z86718 Personal history of other venous thrombosis and embolism: Secondary | ICD-10-CM | POA: Insufficient documentation

## 2021-12-17 DIAGNOSIS — I82402 Acute embolism and thrombosis of unspecified deep veins of left lower extremity: Secondary | ICD-10-CM | POA: Diagnosis not present

## 2021-12-17 DIAGNOSIS — N028 Recurrent and persistent hematuria with other morphologic changes: Secondary | ICD-10-CM | POA: Insufficient documentation

## 2021-12-17 DIAGNOSIS — Z7901 Long term (current) use of anticoagulants: Secondary | ICD-10-CM | POA: Insufficient documentation

## 2021-12-17 DIAGNOSIS — G629 Polyneuropathy, unspecified: Secondary | ICD-10-CM | POA: Insufficient documentation

## 2021-12-17 DIAGNOSIS — D500 Iron deficiency anemia secondary to blood loss (chronic): Secondary | ICD-10-CM | POA: Insufficient documentation

## 2021-12-17 NOTE — Progress Notes (Signed)
[place] [phone]   Fax:(336) [phone]  PROGRESS NOTE  Patient Care Team: Virginia Rochester, [place] as PCP - General (Family Medicine)  Hematological/Oncological History # Recurrent Unprovoked Left Lower Extremity DVT  1) 08/25/2014: patient presented with left calf swelling and was found to have acute occlusive DVT involving 1 of the 2 paired peroneal veins below the knee to the ankle on LE [name]. Unprovoked.  2) 08/25/14-01/17/2015: Xarelto therapy.  3) 07/20/2017: diagnosed with a second left lower extremity unprovoked DVT. US showed evidence of DVT of one of the two LEFT posterior tibial  veins, probably acute. 4) 07/20/2017-present: started on Xarelto, but transitioned to apixaban 5mg  BID Nov 2019 5) 08/28/2019: establish care with Dr. Lorenso Courier  6) 12/02/2020: patient agreeable to transition to maintenance dose eliquis 2.5 mg BID.  7) 12/12/2021: [facility] labs show hemoglobin of 12.3, MCV 91, and a creatinine of 1.4.  Interval History:  Holly Owens 46 y.o. female with medical history significant for recurrent unprovoked LLE DVT who presents for a follow up visit. The patient's last visit was on 06/18/2021 at which time she established care. In the interim since the last visit she has had no changes in her health no ED or emergency room visits.  On exam today Holly Owens reports in June she had a gout flare as well as development of Achilles tendinitis.  She reports that she had both on the same time.  She notes that she does have some tingling and neuropathy occasionally in her hands.  She reports that her blood pressure has been elevated but is getting progressively better.  She reports that she feels quite sore in her upper extremities from time to time, particular when she stretches.  She reports that she does not eat any meat has been trying to keep it that way.  Her labs were collected on 12/12/2021 and showed a hemoglobin of 12.3, MCV 91, and a creatinine of 1.4.   She notes that she continues to take iron pills and is not having any difficulty with that.  They are not causing any stomach upset for constipation.  Reports that she has had no issues with bleeding, bruising, or dark stools on Eliquis therapy.  She otherwise denies any fevers, chills, sweats, nausea, vomiting or diarrhea.   She also has no evidence of residual or recurrent DVT, no shortness of breath or chest pain.  A full 10 point ROS is listed below.  MEDICAL HISTORY:  Past Medical History:  Diagnosis Date   Protein in urine    Renal disorder     SURGICAL HISTORY: No past surgical history on file.  SOCIAL HISTORY: Social History   Socioeconomic History   Marital status: Married    Spouse name: Not on file   Number of children: Not on file   Years of education: Not on file   Highest education level: Not on file  Occupational History   Not on file  Tobacco Use   Smoking status: Never   Smokeless tobacco: Never  Substance and Sexual Activity   Alcohol use: No   Drug use: No   Sexual activity: Yes    Birth control/protection: None  Other Topics Concern   Not on file  Social History Narrative   Not on file   Social Determinants of Health   Financial Resource Strain: Not on file  Food Insecurity: Not on file  Transportation Needs: Not on file  Physical Activity: Not on file  Stress: Not on  file  Social Connections: Not on file  Intimate Partner Violence: Not on file    FAMILY HISTORY: Family History  Problem Relation Age of Onset   Diabetes Mother    Hypertension Mother    Healthy Father    Healthy Sister    Healthy Brother    Hypertension Maternal Aunt    Diabetes Maternal Aunt     ALLERGIES:  has No Known Allergies.  MEDICATIONS:  Current Outpatient Medications  Medication Sig Dispense Refill   Ascorbic Acid (VITAMIN C) 500 MG CAPS Take 1 capsule by mouth daily.     cetirizine (ZYRTEC) 10 MG tablet Take 10 mg by mouth as needed for allergies.      cholecalciferol (VITAMIN D) 1000 UNITS tablet Take 1,000 Units by mouth daily.     ELIQUIS 2.5 MG TABS tablet TAKE 1 TABLET(2.5 MG) BY MOUTH TWICE DAILY 180 tablet 2   ferrous sulfate 325 (65 FE) MG tablet Take by mouth.     lisinopril (ZESTRIL) 20 MG tablet TAKE 1 TABLET(20 MG) BY MOUTH DAILY     Omega-3 Fatty Acids (FISH OIL) 1000 MG CAPS Take 1 capsule by mouth 3 (three) times daily.     No current facility-administered medications for this visit.    REVIEW OF SYSTEMS:   Constitutional: ( - ) fevers, ( - )  chills , ( - ) night sweats Eyes: ( - ) blurriness of vision, ( - ) double vision, ( - ) watery eyes Ears, nose, mouth, throat, and face: ( - ) mucositis, ( - ) sore throat Respiratory: ( - ) cough, ( - ) dyspnea, ( - ) wheezes Cardiovascular: ( - ) palpitation, ( - ) chest discomfort, ( - ) lower extremity swelling Gastrointestinal:  ( - ) nausea, ( - ) heartburn, ( - ) change in bowel habits Skin: ( - ) abnormal skin rashes Lymphatics: ( - ) new lymphadenopathy, ( - ) easy bruising Neurological: ( - ) numbness, ( - ) tingling, ( - ) new weaknesses Behavioral/Psych: ( - ) mood change, ( - ) new changes  All other systems were reviewed with the patient and are negative.  PHYSICAL EXAMINATION:  Vitals:   12/17/21 1546  BP: (!) 105/7  Pulse: 64  Resp: 18  Temp: 98.7 F (37.1 C)  SpO2: 100%   Filed Weights   12/17/21 1546  Weight: 172 lb 12.8 oz (78.4 kg)    GENERAL: well appearing middle aged [place] American female. alert, no distress and comfortable SKIN: skin color, texture, turgor are normal, no rashes or significant lesions EYES: conjunctiva are pink and non-injected, sclera clear LUNGS: clear to auscultation and percussion with normal breathing effort HEART: regular rate & rhythm and no murmurs and no lower extremity edema Musculoskeletal: no cyanosis of digits and no clubbing  PSYCH: alert & oriented x 3, fluent speech NEURO: no focal motor/sensory  deficits  LABORATORY DATA:  I have reviewed the data as listed    Latest Ref Rng & Units 02/28/2020    2:50 PM 08/28/2019    3:12 PM 08/25/2014   11:20 AM  CBC  WBC 4.0 - 10.5 K/uL 7.0  7.5  7.2   Hemoglobin 12.0 - 15.0 g/dL 9.7  10.9  11.9   Hematocrit 36.0 - 46.0 % 30.1  33.2  35.6   Platelets 150 - 400 K/uL 243  239  184        Latest Ref Rng & Units 02/28/2020    2:50 PM  08/28/2019    3:12 PM 08/25/2014   11:20 AM  CMP  Glucose 70 - 99 mg/dL 93  92  99   BUN 6 - 20 mg/dL 23  24  25    Creatinine 0.44 - 1.00 mg/dL  3.08  6.57   Sodium 135 - 145 mmol/L 136  139  137   Potassium 3.5 - 5.1 mmol/L 4.6  4.6  4.7   Chloride 98 - 111 mmol/L 106  111  109   CO2 22 - 32 mmol/L 23  23  23    Calcium 8.9 - 10.3 mg/dL 9.1  8.9  8.8   Total Protein 6.5 - 8.1 g/dL 6.7  6.6    Total Bilirubin 0.3 - 1.2 mg/dL 8.46  0.2    Alkaline Phos 38 - 126 U/L 47  47    AST 15 - 41 U/L 14  16    ALT 0 - 44 U/L 8  10      RADIOGRAPHIC STUDIES: No results found.  ASSESSMENT & PLAN Holly Owens 46 y.o. female with medical history significant for recurrent unprovoked LLE DVT who presents for a follow up visit.   On exam today Holly Owens is doing well.  Not having any issues with bleeding, bruising, or dark stools once medication.  Her insurance is currently covering it without difficulty.  I recommend lifelong anticoagulation given these unprovoked DVTs and the likelihood that the IgA nephropathy is a possible cause that is irreversible.  I noted to the patient that the reason for lifelong anticoagulation is to prevent further VTE's, with concern that a more serious blood clot such as a pulmonary embolism or a stroke could occur.  The patient voiced her understanding of these findings and was agreeable to continuing anticoagulation therapy.  In the event the patient were to develop concerning symptoms with bleeding we could reconsider and rediscuss anticoagulation therapy.  The time being I  would recommend that she continue apixaban 5 mg twice daily with a follow-up visit with Joellen Jersey in approximately 6 months time. After she completes her current   # Recurrent Unprovoked Left Lower Extremity DVT  --agree with prior hematologist that indefinite anticoagulation with apixaban is appropriate for the patient at this time --continue apixaban 2.5mg  BID.   --no evidence of recurrent VTE or PE today. Strict return precautions for symptoms of increased swelling, leg pain, shortness of breath, or chest pain --patient has requested her care be transferred to [facility]. We are happy to be her primary hematology service --RTC in 6 months to re-evaluate.    # Microcytic Anemia # Iron Deficiency Anemia 2/2 to GYN --Hgb in normal range at last check ([facility] records) --requested iron studies, ferritin, and reticulocyte count. Patient gets her labs with [facility] due to cost.  --ferritin improving. Continue PO iron therapy  --continue to monitor    #IgA Nephropathy --it is possible that the patient's coagulopathy is related to protein loss from her IgA nephropathy with heavy proteinuria --defer to nephrology for management  No orders of the defined types were placed in this encounter.  All questions were answered. The patient knows to call the clinic with any problems, questions or concerns.  A total of more than 30 minutes were spent on this encounter and over half of that time was spent on counseling and coordination of care as outlined above.   [name], MD Department of Hematology/Oncology [facility] Phone: [phone] Pager: [phone]  Email: [email or web address]  12/17/2021 9:45 PM

## 2021-12-18 ENCOUNTER — Telehealth: Payer: Self-pay | Admitting: Hematology and Oncology

## 2021-12-18 NOTE — Telephone Encounter (Signed)
Per 8/9 los called and spoke to pt about appointment  pt confirmed appointment  

## 2022-06-03 ENCOUNTER — Other Ambulatory Visit: Payer: Self-pay | Admitting: Hematology and Oncology

## 2022-06-13 ENCOUNTER — Encounter: Payer: Self-pay | Admitting: Hematology and Oncology

## 2022-06-22 ENCOUNTER — Other Ambulatory Visit: Payer: Self-pay

## 2022-06-22 ENCOUNTER — Inpatient Hospital Stay: Payer: BC Managed Care – PPO | Attending: Hematology and Oncology | Admitting: Hematology and Oncology

## 2022-06-22 VITALS — BP 129/88 | HR 70 | Temp 98.1°F | Resp 15 | Wt 178.9 lb

## 2022-06-22 DIAGNOSIS — N02B1 Recurrent and persistent immunoglobulin A nephropathy with glomerular lesion: Secondary | ICD-10-CM | POA: Insufficient documentation

## 2022-06-22 DIAGNOSIS — I82402 Acute embolism and thrombosis of unspecified deep veins of left lower extremity: Secondary | ICD-10-CM | POA: Diagnosis not present

## 2022-06-22 DIAGNOSIS — Z7901 Long term (current) use of anticoagulants: Secondary | ICD-10-CM | POA: Diagnosis not present

## 2022-06-22 DIAGNOSIS — Z86718 Personal history of other venous thrombosis and embolism: Secondary | ICD-10-CM | POA: Insufficient documentation

## 2022-06-22 DIAGNOSIS — D500 Iron deficiency anemia secondary to blood loss (chronic): Secondary | ICD-10-CM | POA: Diagnosis not present

## 2022-06-22 NOTE — Progress Notes (Signed)
[facility] [phone]   [phone]  PROGRESS NOTE  Patient Care Team: Virginia Rochester, [place] as PCP - General (Family Medicine)  Hematological/Oncological History # Recurrent Unprovoked Left Lower Extremity DVT  1) 08/25/2014: patient presented with left calf swelling and was found to have acute occlusive DVT involving 1 of the 2 paired peroneal veins below the knee to the ankle on LE [name]. Unprovoked.  2) 08/25/14-01/17/2015: Xarelto therapy.  3) 07/20/2017: diagnosed with a second left lower extremity unprovoked DVT. US showed evidence of DVT of one of the two LEFT posterior tibial  veins, probably acute. 4) 07/20/2017-present: started on Xarelto, but transitioned to apixaban 69m BID Nov 2019 5) 08/28/2019: establish care with Dr. DLorenso Courier 6) 12/02/2020: patient agreeable to transition to maintenance dose eliquis 2.5 mg BID.  7) 12/12/2021: [facility] labs show hemoglobin of 12.3, MCV 91, and a creatinine of 1.4.  Interval History:  SAnnice NibbeMcGoogan 47y.o. female with medical history significant for recurrent unprovoked LLE DVT who presents for a follow up visit. The patient's last visit was on 12/17/2021 at which time she established care. In the interim since the last visit she has had no changes in her health no ED or emergency room visits.  On exam today Mrs. Nachreiner reports she has been feeling good in the interim since her last visit.  She reports her energy is about a 7 out of 10.  She reports that she is taking her iron pills without any stomach upset or constipation.  She is also tolerating her Eliquis pills without any bleeding, bruising, or dark stools.  She reports that she is paying approximately $30 per month for the medication.  She notes that she has not had any viral illnesses or emergency department visits in the interim since her last discussion.  She reports that she does have a pain in her shoulder for which she will be going to physical therapy.   She has a first appointment coming up soon.  She notes she also continues to follow with nephrology and that her creatinine levels are stable.  She denies having any lightheadedness, dizziness, or shortness of breath.  She is not having any signs or symptoms concerning for recurrent VTE.  She otherwise denies any fevers, chills, sweats, nausea, vomiting or diarrhea.  A full 10 point ROS is listed below.  MEDICAL HISTORY:  Past Medical History:  Diagnosis Date   Protein in urine    Renal disorder     SURGICAL HISTORY: No past surgical history on file.  SOCIAL HISTORY: Social History   Socioeconomic History   Marital status: Married    Spouse name: Not on file   Number of children: Not on file   Years of education: Not on file   Highest education level: Not on file  Occupational History   Not on file  Tobacco Use   Smoking status: Never   Smokeless tobacco: Never  Substance and Sexual Activity   Alcohol use: No   Drug use: No   Sexual activity: Yes    Birth control/protection: None  Other Topics Concern   Not on file  Social History Narrative   Not on file   Social Determinants of Health   Financial Resource Strain: Not on file  Food Insecurity: Not on file  Transportation Needs: Not on file  Physical Activity: Not on file  Stress: Not on file  Social Connections: Not on file  Intimate Partner Violence: Not on file  FAMILY HISTORY: Family History  Problem Relation Age of Onset   Diabetes Mother    Hypertension Mother    Healthy Father    Healthy Sister    Healthy Brother    Hypertension Maternal Aunt    Diabetes Maternal Aunt     ALLERGIES:  has No Known Allergies.  MEDICATIONS:  Current Outpatient Medications  Medication Sig Dispense Refill   Ascorbic Acid (VITAMIN C) 500 MG CAPS Take 1 capsule by mouth daily.     cetirizine (ZYRTEC) 10 MG tablet Take 10 mg by mouth as needed for allergies.     cholecalciferol (VITAMIN D) 1000 UNITS tablet Take 1,000  Units by mouth daily.     ELIQUIS 2.5 MG TABS tablet TAKE 1 TABLET(2.5 MG) BY MOUTH TWICE DAILY 180 tablet 2   ferrous sulfate 325 (65 FE) MG tablet Take by mouth.     lisinopril (ZESTRIL) 20 MG tablet TAKE 1 TABLET(20 MG) BY MOUTH DAILY     Omega-3 Fatty Acids (FISH OIL) 1000 MG CAPS Take 1 capsule by mouth 3 (three) times daily.     No current facility-administered medications for this visit.    REVIEW OF SYSTEMS:   Constitutional: ( - ) fevers, ( - )  chills , ( - ) night sweats Eyes: ( - ) blurriness of vision, ( - ) double vision, ( - ) watery eyes Ears, nose, mouth, throat, and face: ( - ) mucositis, ( - ) sore throat Respiratory: ( - ) cough, ( - ) dyspnea, ( - ) wheezes Cardiovascular: ( - ) palpitation, ( - ) chest discomfort, ( - ) lower extremity swelling Gastrointestinal:  ( - ) nausea, ( - ) heartburn, ( - ) change in bowel habits Skin: ( - ) abnormal skin rashes Lymphatics: ( - ) new lymphadenopathy, ( - ) easy bruising Neurological: ( - ) numbness, ( - ) tingling, ( - ) new weaknesses Behavioral/Psych: ( - ) mood change, ( - ) new changes  All other systems were reviewed with the patient and are negative.  PHYSICAL EXAMINATION:  Vitals:   06/22/22 1545  BP: 129/88  Pulse: 70  Resp: 15  Temp: 98.1 F (36.7 C)  SpO2: 100%    Filed Weights   06/22/22 1545  Weight: 178 lb 14.4 oz (81.1 kg)     GENERAL: well appearing middle aged [place] American female. alert, no distress and comfortable SKIN: skin color, texture, turgor are normal, no rashes or significant lesions EYES: conjunctiva are pink and non-injected, sclera clear LUNGS: clear to auscultation and percussion with normal breathing effort HEART: regular rate & rhythm and no murmurs and no lower extremity edema Musculoskeletal: no cyanosis of digits and no clubbing  PSYCH: alert & oriented x 3, fluent speech NEURO: no focal motor/sensory deficits  LABORATORY DATA:  I have reviewed the data as  listed    Latest Ref Rng & Units 02/28/2020    2:50 PM 08/28/2019    3:12 PM 08/25/2014   11:20 AM  CBC  WBC 4.0 - 10.5 K/uL 7.0  7.5  7.2   Hemoglobin 12.0 - 15.0 g/dL 9.7  10.9  11.9   Hematocrit 36.0 - 46.0 % 30.1  33.2  35.6   Platelets 150 - 400 K/uL 243  239  184        Latest Ref Rng & Units 02/28/2020    2:50 PM 08/28/2019    3:12 PM 08/25/2014   11:20 AM  CMP  Glucose 70 -  99 mg/dL 93  92  99   BUN 6 - 20 mg/dL 23  24  25   $ Creatinine 0.44 - 1.00 mg/dL 1.46  1.54  1.64   Sodium 135 - 145 mmol/L 136  139  137   Potassium 3.5 - 5.1 mmol/L 4.6  4.6  4.7   Chloride 98 - 111 mmol/L 106  111  109   CO2 22 - 32 mmol/L 23  23  23   $ Calcium 8.9 - 10.3 mg/dL 9.1  8.9  8.8   Total Protein 6.5 - 8.1 g/dL 6.7  6.6    Total Bilirubin 0.3 - 1.2 mg/dL <0.2  0.2    Alkaline Phos 38 - 126 U/L 47  47    AST 15 - 41 U/L 14  16    ALT 0 - 44 U/L 8  10      RADIOGRAPHIC STUDIES: No results found.  ASSESSMENT & PLAN Marlene Badenhop Fread 47 y.o. female with medical history significant for recurrent unprovoked LLE DVT who presents for a follow up visit.   On exam today Ms. Wert is doing well.  Not having any issues with bleeding, bruising, or dark stools once medication.  Her insurance is currently covering it without difficulty.  I recommend lifelong anticoagulation given these unprovoked DVTs and the likelihood that the IgA nephropathy is a possible cause that is irreversible.  I noted to the patient that the reason for lifelong anticoagulation is to prevent further VTE's, with concern that a more serious blood clot such as a pulmonary embolism or a stroke could occur.  The patient voiced her understanding of these findings and was agreeable to continuing anticoagulation therapy.  In the event the patient were to develop concerning symptoms with bleeding we could reconsider and rediscuss anticoagulation therapy.  The time being I would recommend that she continue apixaban 5 mg twice daily  with a follow-up visit with [name] in approximately 6 months time. After she completes her current   # Recurrent Unprovoked Left Lower Extremity DVT  --agree with prior hematologist that indefinite anticoagulation with apixaban is appropriate for the patient at this time --continue apixaban 2.18m BID.   --no evidence of recurrent VTE or PE today. Strict return precautions for symptoms of increased swelling, leg pain, shortness of breath, or chest pain --[facility] We are happy to be her primary hematology service --RTC in 6 months to re-evaluate.    # Microcytic Anemia-resolved.  # Iron Deficiency Anemia 2/2 to GYN --lab today show white blood cell 7.4, hemoglobin 12.9, MCV 91, and platelets of 230.  Creatinine 1.48.  10 was 52 with iron sat of 16%. (Based on [facility] outside results the patient provides)  --requested iron studies, ferritin, and reticulocyte count. Patient gets her labs with [facility] due to cost.  --ferritin improving. Continue PO iron therapy  --continue to monitor    #IgA Nephropathy --it is possible that the patient's coagulopathy is related to protein loss from her IgA nephropathy with heavy proteinuria --defer to nephrology for management  No orders of the defined types were placed in this encounter.  All questions were answered. The patient knows to call the clinic with any problems, questions or concerns.  A total of more than 30 minutes were spent on this encounter and over half of that time was spent on counseling and coordination of care as outlined above.   JLedell Peoples MD Department of Hematology/Oncology [facility] Phone: [phone] Pager: [phone] Email: [email or web address]  06/23/2022 8:54 AM

## 2022-06-25 ENCOUNTER — Encounter: Payer: Self-pay | Admitting: Hematology and Oncology

## 2022-12-03 ENCOUNTER — Telehealth: Payer: Self-pay | Admitting: Hematology and Oncology

## 2022-12-04 ENCOUNTER — Telehealth: Payer: Self-pay | Admitting: *Deleted

## 2022-12-04 NOTE — Telephone Encounter (Signed)
Fax'd Heme clearance/instructions for colonscopy to [facility]. Pt is on Eliquis. Pt aware. Received fax confirmation.

## 2022-12-07 ENCOUNTER — Encounter: Payer: Self-pay | Admitting: Hematology and Oncology

## 2022-12-14 ENCOUNTER — Encounter: Payer: Self-pay | Admitting: *Deleted

## 2022-12-15 ENCOUNTER — Other Ambulatory Visit: Payer: Self-pay | Admitting: Hematology and Oncology

## 2022-12-15 ENCOUNTER — Other Ambulatory Visit: Payer: Self-pay

## 2022-12-15 ENCOUNTER — Inpatient Hospital Stay: Payer: BC Managed Care – PPO | Attending: Hematology and Oncology | Admitting: Hematology and Oncology

## 2022-12-15 VITALS — BP 108/70 | HR 87 | Temp 98.3°F | Resp 15 | Wt 173.1 lb

## 2022-12-15 DIAGNOSIS — D500 Iron deficiency anemia secondary to blood loss (chronic): Secondary | ICD-10-CM

## 2022-12-15 DIAGNOSIS — Z7901 Long term (current) use of anticoagulants: Secondary | ICD-10-CM | POA: Insufficient documentation

## 2022-12-15 DIAGNOSIS — Z86718 Personal history of other venous thrombosis and embolism: Secondary | ICD-10-CM | POA: Diagnosis present

## 2022-12-15 DIAGNOSIS — D509 Iron deficiency anemia, unspecified: Secondary | ICD-10-CM | POA: Diagnosis not present

## 2022-12-15 DIAGNOSIS — I82402 Acute embolism and thrombosis of unspecified deep veins of left lower extremity: Secondary | ICD-10-CM

## 2022-12-15 DIAGNOSIS — N02B1 Recurrent and persistent immunoglobulin A nephropathy with glomerular lesion: Secondary | ICD-10-CM | POA: Diagnosis not present

## 2022-12-15 NOTE — Progress Notes (Signed)
[facility] [phone]   [phone]  PROGRESS NOTE  Patient Care Team: Daylene Katayama, [place] as PCP - General (Family Medicine)  Hematological/Oncological History # Recurrent Unprovoked Left Lower Extremity DVT  1) 08/25/2014: patient presented with left calf swelling and was found to have acute occlusive DVT involving 1 of the 2 paired peroneal veins below the knee to the ankle on LE [name]. Unprovoked.  2) 08/25/14-01/17/2015: Xarelto therapy.  3) 07/20/2017: diagnosed with a second left lower extremity unprovoked DVT. US showed evidence of DVT of one of the two LEFT posterior tibial  veins, probably acute. 4) 07/20/2017-present: started on Xarelto, but transitioned to apixaban 5mg  BID Nov 2019 5) 08/28/2019: establish care with Dr. Leonides Schanz  6) 12/02/2020: patient agreeable to transition to maintenance dose eliquis 2.5 mg BID.  7) 12/12/2021: [facility] labs show hemoglobin of 12.3, MCV 91, and a creatinine of 1.4.  Interval History:  Holly Owens 47 y.o. female with medical history significant for recurrent unprovoked LLE DVT who presents for a follow up visit. The patient's last visit was on 06/22/2022. In the interim since the last visit she has had no changes in her health no ED or emergency room visits.  On exam today Holly Owens reports she is somewhat sleepy today but usually her energy level is good.  She reports that she has had no major changes in her health in the since her last visit.  She takes her Eliquis 2.5 mg twice daily with no difficulty.  She is not having any bleeding, bruising, or dark stools.  She continues to pay $30 a month for her supply.  She reports that she takes her iron pills every day, but is now taking half a pill because it was causing constipation.  Her energy overall is currently about an 8 out of 10.  She is not having any shortness of breath, lightheadedness, or dizziness. She denies having any lightheadedness, dizziness, or  shortness of breath.  She is not having any signs or symptoms concerning for recurrent VTE.  She otherwise denies any fevers, chills, sweats, nausea, vomiting or diarrhea.  A full 10 point ROS is listed below.  MEDICAL HISTORY:  Past Medical History:  Diagnosis Date   Protein in urine    Renal disorder     SURGICAL HISTORY: No past surgical history on file.  SOCIAL HISTORY: Social History   Socioeconomic History   Marital status: Married    Spouse name: Not on file   Number of children: Not on file   Years of education: Not on file   Highest education level: Not on file  Occupational History   Not on file  Tobacco Use   Smoking status: Never   Smokeless tobacco: Never  Substance and Sexual Activity   Alcohol use: No   Drug use: No   Sexual activity: Yes    Birth control/protection: None  Other Topics Concern   Not on file  Social History Narrative   Not on file   Social Determinants of Health   Financial Resource Strain: Not on file  Food Insecurity: Not on file  Transportation Needs: Not on file  Physical Activity: Not on file  Stress: Not on file  Social Connections: Not on file  Intimate Partner Violence: Not on file    FAMILY HISTORY: Family History  Problem Relation Age of Onset   Diabetes Mother    Hypertension Mother    Healthy Father    Healthy Sister  Healthy Brother    Hypertension Maternal Aunt    Diabetes Maternal Aunt     ALLERGIES:  has No Known Allergies.  MEDICATIONS:  Current Outpatient Medications  Medication Sig Dispense Refill   Ascorbic Acid (VITAMIN C) 500 MG CAPS Take 1 capsule by mouth daily.     cetirizine (ZYRTEC) 10 MG tablet Take 10 mg by mouth as needed for allergies.     cholecalciferol (VITAMIN D) 1000 UNITS tablet Take 1,000 Units by mouth daily.     ELIQUIS 2.5 MG TABS tablet TAKE 1 TABLET(2.5 MG) BY MOUTH TWICE DAILY 180 tablet 2   ferrous sulfate 325 (65 FE) MG tablet Take by mouth.     lisinopril (ZESTRIL) 20  MG tablet TAKE 1 TABLET(20 MG) BY MOUTH DAILY     Omega-3 Fatty Acids (FISH OIL) 1000 MG CAPS Take 1 capsule by mouth 3 (three) times daily.     colchicine 0.6 MG tablet Take 2 tablets at onset of gout and (1) 2 hrs later. (Patient not taking: Reported on 12/15/2022)     No current facility-administered medications for this visit.    REVIEW OF SYSTEMS:   Constitutional: ( - ) fevers, ( - )  chills , ( - ) night sweats Eyes: ( - ) blurriness of vision, ( - ) double vision, ( - ) watery eyes Ears, nose, mouth, throat, and face: ( - ) mucositis, ( - ) sore throat Respiratory: ( - ) cough, ( - ) dyspnea, ( - ) wheezes Cardiovascular: ( - ) palpitation, ( - ) chest discomfort, ( - ) lower extremity swelling Gastrointestinal:  ( - ) nausea, ( - ) heartburn, ( - ) change in bowel habits Skin: ( - ) abnormal skin rashes Lymphatics: ( - ) new lymphadenopathy, ( - ) easy bruising Neurological: ( - ) numbness, ( - ) tingling, ( - ) new weaknesses Behavioral/Psych: ( - ) mood change, ( - ) new changes  All other systems were reviewed with the patient and are negative.  PHYSICAL EXAMINATION:  Vitals:   12/15/22 1536  BP: 108/70  Pulse: 87  Resp: 15  Temp: 98.3 F (36.8 C)  SpO2: 99%     Filed Weights   12/15/22 1536  Weight: 173 lb 1.6 oz (78.5 kg)      GENERAL: well appearing middle aged [place] American female. alert, no distress and comfortable SKIN: skin color, texture, turgor are normal, no rashes or significant lesions EYES: conjunctiva are pink and non-injected, sclera clear LUNGS: clear to auscultation and percussion with normal breathing effort HEART: regular rate & rhythm and no murmurs and no lower extremity edema Musculoskeletal: no cyanosis of digits and no clubbing  PSYCH: alert & oriented x 3, fluent speech NEURO: no focal motor/sensory deficits  LABORATORY DATA:  I have reviewed the data as listed    Latest Ref Rng & Units 02/28/2020    2:50 PM 08/28/2019    3:12  PM 08/25/2014   11:20 AM  CBC  WBC 4.0 - 10.5 K/uL 7.0  7.5  7.2   Hemoglobin 12.0 - 15.0 g/dL 9.7  16.1  09.6   Hematocrit 36.0 - 46.0 % 30.1  33.2  35.6   Platelets 150 - 400 K/uL 243  239  184        Latest Ref Rng & Units 02/28/2020    2:50 PM 08/28/2019    3:12 PM 08/25/2014   11:20 AM  CMP  Glucose 70 - 99 mg/dL 93  92  99   BUN 6 - 20 mg/dL 23  24  25    Creatinine 0.44 - 1.00 mg/dL 0.45  4.09  8.11   Sodium 135 - 145 mmol/L 136  139  137   Potassium 3.5 - 5.1 mmol/L 4.6  4.6  4.7   Chloride 98 - 111 mmol/L 106  111  109   CO2 22 - 32 mmol/L 23  23  23    Calcium 8.9 - 10.3 mg/dL 9.1  8.9  8.8   Total Protein 6.5 - 8.1 g/dL 6.7  6.6    Total Bilirubin 0.3 - 1.2 mg/dL <9.1  0.2    Alkaline Phos 38 - 126 U/L 47  47    AST 15 - 41 U/L 14  16    ALT 0 - 44 U/L 8  10      RADIOGRAPHIC STUDIES: No results found.  ASSESSMENT & PLAN Holly Owens 47 y.o. female with medical history significant for recurrent unprovoked LLE DVT who presents for a follow up visit.   On exam today Ms. Owens is doing well.  Not having any issues with bleeding, bruising, or dark stools once medication.  Her insurance is currently covering it without difficulty.  I recommend lifelong anticoagulation given these unprovoked DVTs and the likelihood that the IgA nephropathy is a possible cause that is irreversible.  I noted to the patient that the reason for lifelong anticoagulation is to prevent further VTE's, with concern that a more serious blood clot such as a pulmonary embolism or a stroke could occur.  The patient voiced her understanding of these findings and was agreeable to continuing anticoagulation therapy.  In the event the patient were to develop concerning symptoms with bleeding we could reconsider and rediscuss anticoagulation therapy.  The time being I would recommend that she continue apixaban 5 mg twice daily with a follow-up visit with [name] in approximately 6 months time. After she  completes her current   # Recurrent Unprovoked Left Lower Extremity DVT  --agree with prior hematologist that indefinite anticoagulation with apixaban is appropriate for the patient at this time --continue apixaban 2.5mg  BID.   --no evidence of recurrent VTE or PE today. Strict return precautions for symptoms of increased swelling, leg pain, shortness of breath, or chest pain --patient has requested her care be transferred to [facility] We are happy to be her primary hematology service --RTC in 6 months to re-evaluate.    # Microcytic Anemia-resolved.  # Iron Deficiency Anemia 2/2 to GYN --lab today show white blood cell 5.4, hemoglobin 11.0, MCV 93, platelets 194.  Iron saturation 15% with ferritin of 39. (Based on [facility] outside results the patient provides)  --requested iron studies, ferritin, and reticulocyte count. Patient gets her labs with [facility] due to cost.  --ferritin improving. Continue PO iron therapy  --continue to monitor    #IgA Nephropathy --it is possible that the patient's coagulopathy is related to protein loss from her IgA nephropathy with heavy proteinuria --defer to nephrology for management  No orders of the defined types were placed in this encounter.  All questions were answered. The patient knows to call the clinic with any problems, questions or concerns.  A total of more than 30 minutes were spent on this encounter and over half of that time was spent on counseling and coordination of care as outlined above.   Ulysees Barns, MD Department of Hematology/Oncology [facility] Phone: [phone] Pager:  [phone] Email: [email or web address]  12/15/2022 4:08 PM

## 2022-12-23 ENCOUNTER — Ambulatory Visit: Payer: BC Managed Care – PPO | Admitting: Hematology and Oncology

## 2023-02-27 ENCOUNTER — Other Ambulatory Visit: Payer: Self-pay | Admitting: Hematology and Oncology

## 2023-03-17 ENCOUNTER — Telehealth: Payer: Self-pay | Admitting: Hematology and Oncology

## 2023-05-13 ENCOUNTER — Telehealth: Payer: Self-pay | Admitting: Hematology and Oncology

## 2023-05-13 NOTE — Telephone Encounter (Signed)
Rescheduled appointment per provider on-call. Patient is aware of the changes made to her upcoming appointment.

## 2023-06-16 ENCOUNTER — Ambulatory Visit: Payer: BC Managed Care – PPO | Admitting: Hematology and Oncology

## 2023-06-22 ENCOUNTER — Encounter: Payer: Self-pay | Admitting: Hematology and Oncology

## 2023-06-22 ENCOUNTER — Other Ambulatory Visit: Payer: Self-pay | Admitting: *Deleted

## 2023-06-22 DIAGNOSIS — D500 Iron deficiency anemia secondary to blood loss (chronic): Secondary | ICD-10-CM

## 2023-06-22 NOTE — Progress Notes (Signed)
Faxed lab orders to [facility] at [phone]. Confirmation receipt of fax received

## 2023-06-28 ENCOUNTER — Other Ambulatory Visit: Payer: Self-pay | Admitting: *Deleted

## 2023-06-28 ENCOUNTER — Inpatient Hospital Stay: Payer: 59 | Attending: Hematology and Oncology | Admitting: Hematology and Oncology

## 2023-06-28 VITALS — BP 125/86 | HR 70 | Temp 98.0°F | Resp 18 | Ht 62.0 in | Wt 179.3 lb

## 2023-06-28 DIAGNOSIS — Z86718 Personal history of other venous thrombosis and embolism: Secondary | ICD-10-CM | POA: Diagnosis present

## 2023-06-28 DIAGNOSIS — I82402 Acute embolism and thrombosis of unspecified deep veins of left lower extremity: Secondary | ICD-10-CM | POA: Diagnosis not present

## 2023-06-28 DIAGNOSIS — Z7901 Long term (current) use of anticoagulants: Secondary | ICD-10-CM | POA: Diagnosis not present

## 2023-06-28 DIAGNOSIS — N02B1 Recurrent and persistent immunoglobulin A nephropathy with glomerular lesion: Secondary | ICD-10-CM | POA: Diagnosis not present

## 2023-06-28 DIAGNOSIS — D500 Iron deficiency anemia secondary to blood loss (chronic): Secondary | ICD-10-CM

## 2023-06-28 NOTE — Progress Notes (Signed)
 [facility] [phone]   [phone]) [phone]  PROGRESS NOTE  Patient Care Team: Daylene Katayama, [place] as PCP - General (Family Medicine)  Hematological/Oncological History # Recurrent Unprovoked Left Lower Extremity DVT  1) 08/25/2014: patient presented with left calf swelling and was found to have acute occlusive DVT involving 1 of the 2 paired peroneal veins below the knee to the ankle on LE [name]. Unprovoked.  2) 08/25/14-01/17/2015: Xarelto therapy.  3) 07/20/2017: diagnosed with a second left lower extremity unprovoked DVT. US showed evidence of DVT of one of the two LEFT posterior tibial  veins, probably acute. 4) 07/20/2017-present: started on Xarelto, but transitioned to apixaban 5mg  BID Nov 2019 5) 08/28/2019: establish care with Dr. Leonides Schanz  6) 12/02/2020: patient agreeable to transition to maintenance dose eliquis 2.5 mg BID.  7) 12/12/2021: [facility] labs show hemoglobin of 12.3, MCV 91, and a creatinine of 1.4.  Interval History:  Holly Owens 48 y.o. female with medical history significant for recurrent unprovoked LLE DVT who presents for a follow up visit. The patient's last visit was on 12/15/2022. In the interim since the last visit she has had no changes in her health no ED or emergency room visits.  On exam today Holly Owens reports she has been well overall interim since her last visit.  She has had no issues with infection such as flu or RSV.  She reports her energy levels are good and her appetite is strong.  She reports she has had no trouble with bleeding, bruising, or dark stools.  She reports that she is tolerating her Eliquis well and taking it faithfully as prescribed.  She reports it was costing her $30 per month.  She is not having any signs or symptoms concerning for recurrent VTE such as chest pain, shortness of breath, leg pain, or leg swelling.  She reports overall she feels well and is willing and able to proceed with both the p.o. iron  therapy and Eliquis at this time. She is not having any signs or symptoms concerning for recurrent VTE.  She otherwise denies any fevers, chills, sweats, nausea, vomiting or diarrhea.  A full 10 point ROS is listed below.  MEDICAL HISTORY:  Past Medical History:  Diagnosis Date   Protein in urine    Renal disorder     SURGICAL HISTORY: No past surgical history on file.  SOCIAL HISTORY: Social History   Socioeconomic History   Marital status: Married    Spouse name: Not on file   Number of children: Not on file   Years of education: Not on file   Highest education level: Not on file  Occupational History   Not on file  Tobacco Use   Smoking status: Never   Smokeless tobacco: Never  Substance and Sexual Activity   Alcohol use: No   Drug use: No   Sexual activity: Yes    Birth control/protection: None  Other Topics Concern   Not on file  Social History Narrative   Not on file   Social Drivers of Health   Financial Resource Strain: Not on file  Food Insecurity: Not on file  Transportation Needs: Not on file  Physical Activity: Not on file  Stress: Not on file  Social Connections: Not on file  Intimate Partner Violence: Not on file    FAMILY HISTORY: Family History  Problem Relation Age of Onset   Diabetes Mother    Hypertension Mother    Healthy Father    Healthy  Sister    Healthy Brother    Hypertension Maternal Aunt    Diabetes Maternal Aunt     ALLERGIES:  has no known allergies.  MEDICATIONS:  Current Outpatient Medications  Medication Sig Dispense Refill   Ascorbic Acid (VITAMIN C) 500 MG CAPS Take 1 capsule by mouth daily.     cetirizine (ZYRTEC) 10 MG tablet Take 10 mg by mouth as needed for allergies.     cholecalciferol (VITAMIN D) 1000 UNITS tablet Take 1,000 Units by mouth daily.     colchicine 0.6 MG tablet Take 2 tablets at onset of gout and (1) 2 hrs later. (Patient not taking: Reported on 12/15/2022)     ELIQUIS 2.5 MG TABS tablet TAKE 1  TABLET(2.5 MG) BY MOUTH TWICE DAILY 180 tablet 2   ferrous sulfate 325 (65 FE) MG tablet Take by mouth.     lisinopril (ZESTRIL) 20 MG tablet TAKE 1 TABLET(20 MG) BY MOUTH DAILY     Omega-3 Fatty Acids (FISH OIL) 1000 MG CAPS Take 1 capsule by mouth 3 (three) times daily.     No current facility-administered medications for this visit.    REVIEW OF SYSTEMS:   Constitutional: ( - ) fevers, ( - )  chills , ( - ) night sweats Eyes: ( - ) blurriness of vision, ( - ) double vision, ( - ) watery eyes Ears, nose, mouth, throat, and face: ( - ) mucositis, ( - ) sore throat Respiratory: ( - ) cough, ( - ) dyspnea, ( - ) wheezes Cardiovascular: ( - ) palpitation, ( - ) chest discomfort, ( - ) lower extremity swelling Gastrointestinal:  ( - ) nausea, ( - ) heartburn, ( - ) change in bowel habits Skin: ( - ) abnormal skin rashes Lymphatics: ( - ) new lymphadenopathy, ( - ) easy bruising Neurological: ( - ) numbness, ( - ) tingling, ( - ) new weaknesses Behavioral/Psych: ( - ) mood change, ( - ) new changes  All other systems were reviewed with the patient and are negative.  PHYSICAL EXAMINATION:  Vitals:   06/28/23 1519  BP: 125/86  Pulse: 70  Resp: 18  Temp: 98 F (36.7 C)  SpO2: 100%      Filed Weights   06/28/23 1519  Weight: 179 lb 4.8 oz (81.3 kg)       GENERAL: well appearing middle aged [place] American female. alert, no distress and comfortable SKIN: skin color, texture, turgor are normal, no rashes or significant lesions EYES: conjunctiva are pink and non-injected, sclera clear LUNGS: clear to auscultation and percussion with normal breathing effort HEART: regular rate & rhythm and no murmurs and no lower extremity edema Musculoskeletal: no cyanosis of digits and no clubbing  PSYCH: alert & oriented x 3, fluent speech NEURO: no focal motor/sensory deficits  LABORATORY DATA:  I have reviewed the data as listed    Latest Ref Rng & Units 02/28/2020    2:50 PM  08/28/2019    3:12 PM 08/25/2014   11:20 AM  CBC  WBC 4.0 - 10.5 K/uL 7.0  7.5  7.2   Hemoglobin 12.0 - 15.0 g/dL 9.7  08.6  57.8   Hematocrit 36.0 - 46.0 % 30.1  33.2  35.6   Platelets 150 - 400 K/uL 243  239  184        Latest Ref Rng & Units 02/28/2020    2:50 PM 08/28/2019    3:12 PM 08/25/2014   11:20 AM  CMP  Glucose 70 - 99 mg/dL 93  92  99   BUN 6 - 20 mg/dL 23  24  25    Creatinine 0.44 - 1.00 mg/dL 1.61  0.96  0.45   Sodium 135 - 145 mmol/L 136  139  137   Potassium 3.5 - 5.1 mmol/L 4.6  4.6  4.7   Chloride 98 - 111 mmol/L 106  111  109   CO2 22 - 32 mmol/L 23  23  23    Calcium 8.9 - 10.3 mg/dL 9.1  8.9  8.8   Total Protein 6.5 - 8.1 g/dL 6.7  6.6    Total Bilirubin 0.3 - 1.2 mg/dL <4.0  0.2    Alkaline Phos 38 - 126 U/L 47  47    AST 15 - 41 U/L 14  16    ALT 0 - 44 U/L 8  10      RADIOGRAPHIC STUDIES: No results found.  ASSESSMENT & PLAN Holly Owens 48 y.o. female with medical history significant for recurrent unprovoked LLE DVT who presents for a follow up visit.   On exam today Holly Owens is doing well.  Not having any issues with bleeding, bruising, or dark stools once medication.  Her insurance is currently covering it without difficulty.  I recommend lifelong anticoagulation given these unprovoked DVTs and the likelihood that the IgA nephropathy is a possible cause that is irreversible.  I noted to the patient that the reason for lifelong anticoagulation is to prevent further VTE's, with concern that a more serious blood clot such as a pulmonary embolism or a stroke could occur.  The patient voiced her understanding of these findings and was agreeable to continuing anticoagulation therapy.  In the event the patient were to develop concerning symptoms with bleeding we could reconsider and rediscuss anticoagulation therapy.  The time being I would recommend that she continue apixaban 5 mg twice daily with a follow-up visit with [name] in approximately 6 months  time. After she completes her current   # Recurrent Unprovoked Left Lower Extremity DVT  --agree with prior hematologist that indefinite anticoagulation with apixaban is appropriate for the patient at this time --continue apixaban 2.5mg  BID.   --no evidence of recurrent VTE or PE today. Strict return precautions for symptoms of increased swelling, leg pain, shortness of breath, or chest pain --patient has requested her care be transferred to [facility]. We are happy to be her primary hematology service --RTC in 6 months to re-evaluate.    # Microcytic Anemia-resolved.  # Iron Deficiency Anemia 2/2 to GYN --lab today show white blood cell white blood cell 6.6, hemoglobin 11.6, MCV 93, platelets 249, creatinine 1.31, ferritin of 50 with iron sat of 21% ([facility]) --requested iron studies, ferritin, and reticulocyte count. Patient gets her labs with [facility] due to cost.  --ferritin improving. Continue PO iron therapy  --continue to monitor   #IgA Nephropathy --it is possible that the patient's coagulopathy is related to protein loss from her IgA nephropathy with heavy proteinuria --defer to nephrology for management  No orders of the defined types were placed in this encounter.  All questions were answered. The patient knows to call the clinic with any problems, questions or concerns.  A total of more than 30 minutes were spent on this encounter and over half of that time was spent on counseling and coordination of care as outlined above.   Ulysees Barns, MD Department of Hematology/Oncology [facility]  Phone: [phone] Pager: [phone] Email: [email or web address]  06/28/2023 4:09 PM

## 2023-08-30 ENCOUNTER — Encounter: Payer: Self-pay | Admitting: Hematology and Oncology

## 2023-11-24 ENCOUNTER — Other Ambulatory Visit: Payer: Self-pay | Admitting: Hematology and Oncology

## 2023-12-27 ENCOUNTER — Inpatient Hospital Stay: Payer: 59 | Attending: Hematology and Oncology | Admitting: Hematology and Oncology

## 2023-12-27 VITALS — BP 129/91 | HR 85 | Temp 98.0°F | Resp 18 | Ht 62.0 in | Wt 175.6 lb

## 2023-12-27 DIAGNOSIS — N02B1 Recurrent and persistent immunoglobulin A nephropathy with glomerular lesion: Secondary | ICD-10-CM | POA: Insufficient documentation

## 2023-12-27 DIAGNOSIS — D500 Iron deficiency anemia secondary to blood loss (chronic): Secondary | ICD-10-CM

## 2023-12-27 DIAGNOSIS — Z862 Personal history of diseases of the blood and blood-forming organs and certain disorders involving the immune mechanism: Secondary | ICD-10-CM | POA: Insufficient documentation

## 2023-12-27 DIAGNOSIS — Z86718 Personal history of other venous thrombosis and embolism: Secondary | ICD-10-CM | POA: Insufficient documentation

## 2023-12-27 DIAGNOSIS — I82402 Acute embolism and thrombosis of unspecified deep veins of left lower extremity: Secondary | ICD-10-CM | POA: Diagnosis not present

## 2023-12-27 DIAGNOSIS — Z7901 Long term (current) use of anticoagulants: Secondary | ICD-10-CM | POA: Diagnosis not present

## 2023-12-27 NOTE — Progress Notes (Signed)
 [facility] [phone]   Fax:([phone]  PROGRESS NOTE  Patient Care Team: Holly Owens, Holly Owens as PCP - General (Family Medicine)  Hematological/Oncological History # Recurrent Unprovoked Left Lower Extremity DVT  1) 08/25/2014: patient presented with left calf swelling and was found to have acute occlusive DVT involving 1 of the 2 paired peroneal veins below the knee to the ankle on LE US . Unprovoked.  2) 08/25/14-01/17/2015: Xarelto  therapy.  3) 07/20/2017: diagnosed with a second left lower extremity unprovoked DVT. US  showed evidence of DVT of one of the two LEFT posterior tibial  veins, probably acute. 4) 07/20/2017-present: started on Xarelto , but transitioned to apixaban  5mg  BID Nov 2019 5) 08/28/2019: establish care with Holly Owens  6) 12/02/2020: patient agreeable to transition to maintenance dose eliquis  2.5 mg BID.  7) 12/12/2021: [facility] labs show hemoglobin of 12.3, MCV 91, and a creatinine of 1.4.  Interval History:  Holly Owens 48 y.o. female with medical history significant for recurrent unprovoked LLE DVT who presents for a follow up visit. The patient's last visit was on 06/28/2023. In the interim since the last visit she has had no changes in her health no ED or emergency room visits.  On exam today Holly Owens reports she has been well overall in interim since her last visit.  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  She think she may have a possible pinched nerve in her left side as she has a sharp pain that can shoot down her entire left side with movement.  She reports overall her energy levels are good.  She notes that she has had no nosebleeds, gum bleeding, or dark stools.  Her menstrual cycles are somewhat irregular and she reports she had a cycle lasted from the first to the 14th with a lot of spotting.  She reports that this was a very unusual cycle for her.  She has no other symptoms of  menopause at this time.  She reports she is tolerating her ferrous sulfate well with no nausea, vomiting, or diarrhea.  Is not causing any constipation.  She does not go out of her way to eat iron rich foods but does eat iron rich foods as part of her diet.  She reports she is taking her Eliquis  2.5 mg twice daily and is costing her $30 per month.  She has some bruising on her arms and legs but no large or painful bruising.  A full 10 point ROS is otherwise negative.  MEDICAL HISTORY:  Past Medical History:  Diagnosis Date   Protein in urine    Renal disorder     SURGICAL HISTORY: No past surgical history on file.  SOCIAL HISTORY: Social History   Socioeconomic History   Marital status: Married    Spouse name: Not on file   Number of children: Not on file   Years of education: Not on file   Highest education level: Not on file  Occupational History   Not on file  Tobacco Use   Smoking status: Never   Smokeless tobacco: Never  Substance and Sexual Activity   Alcohol use: No   Drug use: No   Sexual activity: Yes    Birth control/protection: None  Other Topics Concern   Not on file  Social History Narrative   Not on file   Social Drivers of Health   Financial Resource Strain: Not on file  Food Insecurity: Not on  file  Transportation Needs: Not on file  Physical Activity: Not on file  Stress: Not on file  Social Connections: Not on file  Intimate Partner Violence: Not on file    FAMILY HISTORY: Family History  Problem Relation Age of Onset   Diabetes Mother    Hypertension Mother    Healthy Father    Healthy Sister    Healthy Brother    Hypertension Maternal Aunt    Diabetes Maternal Aunt     ALLERGIES:  has no known allergies.  MEDICATIONS:  Current Outpatient Medications  Medication Sig Dispense Refill   Ascorbic Acid (VITAMIN C) 500 MG CAPS Take 1 capsule by mouth daily.     cetirizine (ZYRTEC) 10 MG tablet Take 10 mg by mouth as needed for allergies.      cholecalciferol (VITAMIN D) 1000 UNITS tablet Take 1,000 Units by mouth daily.     colchicine 0.6 MG tablet Take 2 tablets at onset of gout and (1) 2 hrs later. (Patient not taking: Reported on 12/15/2022)     ELIQUIS  2.5 MG TABS tablet TAKE 1 TABLET(2.5 MG) BY MOUTH TWICE DAILY 180 tablet 2   ferrous sulfate 325 (65 FE) MG tablet Take by mouth.     lisinopril (ZESTRIL) 20 MG tablet TAKE 1 TABLET(20 MG) BY MOUTH DAILY     Omega-3 Fatty Acids (FISH OIL) 1000 MG CAPS Take 1 capsule by mouth 3 (three) times daily.     No current facility-administered medications for this visit.    REVIEW OF SYSTEMS:   Constitutional: ( - ) fevers, ( - )  chills , ( - ) night sweats Eyes: ( - ) blurriness of vision, ( - ) double vision, ( - ) watery eyes Ears, nose, mouth, throat, and face: ( - ) mucositis, ( - ) sore throat Respiratory: ( - ) cough, ( - ) dyspnea, ( - ) wheezes Cardiovascular: ( - ) palpitation, ( - ) chest discomfort, ( - ) lower extremity swelling Gastrointestinal:  ( - ) nausea, ( - ) heartburn, ( - ) change in bowel habits Skin: ( - ) abnormal skin rashes Lymphatics: ( - ) new lymphadenopathy, ( - ) easy bruising Neurological: ( - ) numbness, ( - ) tingling, ( - ) new weaknesses Behavioral/Psych: ( - ) mood change, ( - ) new changes  All other systems were reviewed with the patient and are negative.  PHYSICAL EXAMINATION:  Vitals:   12/27/23 1542  BP: (!) 129/91  Pulse: 85  Resp: 18  Temp: 98 F (36.7 C)  SpO2: 97%       Filed Weights   12/27/23 1542  Weight: 175 lb 9.6 oz (79.7 kg)        GENERAL: well appearing middle aged [place] American female. alert, no distress and comfortable SKIN: skin color, texture, turgor are normal, no rashes or significant lesions EYES: conjunctiva are pink and non-injected, sclera clear LUNGS: clear to auscultation and percussion with normal breathing effort HEART: regular rate & rhythm and no murmurs and no lower extremity  edema Musculoskeletal: no cyanosis of digits and no clubbing  PSYCH: alert & oriented x 3, fluent speech NEURO: no focal motor/sensory deficits  LABORATORY DATA:  I have reviewed the data as listed    Latest Ref Rng & Units 02/28/2020    2:50 PM 08/28/2019    3:12 PM 08/25/2014   11:20 AM  CBC  WBC 4.0 - 10.5 K/uL 7.0  7.5  7.2   Hemoglobin  12.0 - 15.0 g/dL 9.7  89.0  88.0   Hematocrit 36.0 - 46.0 % 30.1  33.2  35.6   Platelets 150 - 400 K/uL 243  239  184        Latest Ref Rng & Units 02/28/2020    2:50 PM 08/28/2019    3:12 PM 08/25/2014   11:20 AM  CMP  Glucose 70 - 99 mg/dL 93  92  99   BUN 6 - 20 mg/dL 23  24  25    Creatinine 0.44 - 1.00 mg/dL 8.53  8.45  8.35   Sodium 135 - 145 mmol/L 136  139  137   Potassium 3.5 - 5.1 mmol/L 4.6  4.6  4.7   Chloride 98 - 111 mmol/L 106  111  109   CO2 22 - 32 mmol/L 23  23  23    Calcium 8.9 - 10.3 mg/dL 9.1  8.9  8.8   Total Protein 6.5 - 8.1 g/dL 6.7  6.6    Total Bilirubin 0.3 - 1.2 mg/dL <9.7  0.2    Alkaline Phos 38 - 126 U/L 47  47    AST 15 - 41 U/L 14  16    ALT 0 - 44 U/L 8  10      RADIOGRAPHIC STUDIES: No results found.  ASSESSMENT & PLAN Holly Owens 48 y.o. female with medical history significant for recurrent unprovoked LLE DVT who presents for a follow up visit.   On exam today Holly Owens is doing well.  Not having any issues with bleeding, bruising, or dark stools once medication.  Her insurance is currently covering it without difficulty.  I recommend lifelong anticoagulation given these unprovoked DVTs and the likelihood that the IgA nephropathy is a possible cause that is irreversible.  I noted to the patient that the reason for lifelong anticoagulation is to prevent further VTE's, with concern that a more serious blood clot such as a pulmonary embolism or a stroke could occur.  The patient voiced her understanding of these findings and was agreeable to continuing anticoagulation therapy.  In the event the  patient were to develop concerning symptoms with bleeding we could reconsider and rediscuss anticoagulation therapy.  The time being I would recommend that she continue apixaban  5 mg twice daily with a follow-up visit with us  in approximately 6 months time. After she completes her current   # Recurrent Unprovoked Left Lower Extremity DVT  --agree with prior hematologist that indefinite anticoagulation with apixaban  is appropriate for the patient at this time --continue apixaban  2.5mg  BID.   --no evidence of recurrent VTE or PE today. Strict return precautions for symptoms of increased swelling, leg pain, shortness of breath, or chest pain --patient has requested her care be transferred to [facility]. We are happy to be her primary hematology service --RTC in 6 months to re-evaluate.    # Microcytic Anemia-resolved.  # Iron Deficiency Anemia 2/2 to GYN --lab today show ferritin of 72 with iron sat of 35%.  Unfortunately do not have CBC and CMP, the patient will have to go back to [facility] to get this collected.  ([facility]) --requested iron studies, ferritin, and reticulocyte count. Patient gets her labs with [facility] due to cost.  --ferritin improving. Continue PO iron therapy  --continue to monitor   #IgA Nephropathy --it is possible that the patient's coagulopathy is related to protein loss from her IgA nephropathy with heavy proteinuria --defer to nephrology for management  No orders of the  defined types were placed in this encounter.  All questions were answered. The patient knows to call the clinic with any problems, questions or concerns.  A total of more than 30 minutes were spent on this encounter and over half of that time was spent on counseling and coordination of care as outlined above.   Norleen IVAR Kidney, MD Department of Hematology/Oncology [facility] Phone: [phone] Pager: [phone] Email: [email or web address]  12/27/2023 3:57  PM

## 2024-06-28 ENCOUNTER — Ambulatory Visit: Admitting: Hematology and Oncology

## 2024-06-29 ENCOUNTER — Ambulatory Visit: Admitting: Hematology and Oncology
